# Patient Record
Sex: Female | Born: 1985 | Race: White | Hispanic: No | Marital: Married | Smoking: Never smoker
Health system: Southern US, Community
[De-identification: ages and names within clinical notes are randomized; demographics above are authoritative.]

## PROBLEM LIST (undated history)

## (undated) DIAGNOSIS — E039 Hypothyroidism, unspecified: Secondary | ICD-10-CM

## (undated) DIAGNOSIS — N289 Disorder of kidney and ureter, unspecified: Secondary | ICD-10-CM

## (undated) DIAGNOSIS — E119 Type 2 diabetes mellitus without complications: Secondary | ICD-10-CM

## (undated) HISTORY — PX: OTHER SURGICAL HISTORY: SHX169

## (undated) HISTORY — PX: ABDOMINAL HYSTERECTOMY: SHX81

---

## 2012-06-30 ENCOUNTER — Emergency Department: Payer: Self-pay | Admitting: Emergency Medicine

## 2012-06-30 LAB — COMPREHENSIVE METABOLIC PANEL
Alkaline Phosphatase: 47 U/L — ABNORMAL LOW (ref 50–136)
Bilirubin,Total: 0.4 mg/dL (ref 0.2–1.0)
Calcium, Total: 8.6 mg/dL (ref 8.5–10.1)
Creatinine: 0.69 mg/dL (ref 0.60–1.30)
EGFR (Non-African Amer.): >60
Glucose: 186 mg/dL — ABNORMAL HIGH (ref 65–99)
Osmolality: 275 (ref 275–301)
Potassium: 4.1 mmol/L (ref 3.5–5.1)
SGPT (ALT): 64 U/L (ref 12–78)
Sodium: 135 mmol/L — ABNORMAL LOW (ref 136–145)

## 2012-06-30 LAB — LIPASE, BLOOD: Lipase: 106 U/L (ref 73–393)

## 2012-06-30 LAB — CBC
HCT: 39.4 % (ref 35.0–47.0)
HGB: 13.5 g/dL (ref 12.0–16.0)
MCHC: 34.3 g/dL (ref 32.0–36.0)
MCV: 88 fL (ref 80–100)
Platelet: 232 10*3/uL (ref 150–440)
RDW: 13 % (ref 11.5–14.5)

## 2012-06-30 LAB — URINALYSIS, COMPLETE
Ketone: NEGATIVE
Nitrite: NEGATIVE
Ph: 5 (ref 4.5–8.0)
RBC,UR: 4 /HPF (ref 0–5)
Specific Gravity: 1.017 (ref 1.003–1.030)
WBC UR: 13 /HPF (ref 0–5)

## 2012-07-03 ENCOUNTER — Emergency Department: Payer: Self-pay | Admitting: Emergency Medicine

## 2012-07-03 LAB — URINALYSIS, COMPLETE
Ketone: NEGATIVE
Nitrite: NEGATIVE
Protein: 30
RBC,UR: 4 /HPF (ref 0–5)
Specific Gravity: 1.019 (ref 1.003–1.030)
Squamous Epithelial: 20
WBC UR: NONE SEEN /HPF (ref 0–5)

## 2012-07-03 LAB — COMPREHENSIVE METABOLIC PANEL
Bilirubin,Total: 0.3 mg/dL (ref 0.2–1.0)
Calcium, Total: 8.6 mg/dL (ref 8.5–10.1)
EGFR (African American): >60
Glucose: 147 mg/dL — ABNORMAL HIGH (ref 65–99)
Osmolality: 277 (ref 275–301)
SGPT (ALT): 74 U/L (ref 12–78)
Total Protein: 7.7 g/dL (ref 6.4–8.2)

## 2012-07-03 LAB — CBC
HCT: 38.1 % (ref 35.0–47.0)
MCH: 30.4 pg (ref 26.0–34.0)
MCHC: 34.6 g/dL (ref 32.0–36.0)
MCV: 88 fL (ref 80–100)
RBC: 4.34 10*6/uL (ref 3.80–5.20)
RDW: 12.9 % (ref 11.5–14.5)

## 2012-07-10 ENCOUNTER — Ambulatory Visit: Payer: Self-pay | Admitting: Specialist

## 2012-07-10 ENCOUNTER — Emergency Department: Payer: Self-pay | Admitting: Emergency Medicine

## 2012-07-10 LAB — CBC WITH DIFFERENTIAL/PLATELET
Basophil #: 0.1 10*3/uL (ref 0.0–0.1)
Basophil %: 1 %
Eosinophil #: 0.2 10*3/uL (ref 0.0–0.7)
Eosinophil %: 2.4 %
HGB: 13.5 g/dL (ref 12.0–16.0)
MCH: 30.5 pg (ref 26.0–34.0)
MCHC: 34.5 g/dL (ref 32.0–36.0)
Monocyte #: 0.4 x10 3/mm (ref 0.2–0.9)
Monocyte %: 6.2 %
Neutrophil %: 57 %
Platelet: 253 10*3/uL (ref 150–440)
RBC: 4.41 10*6/uL (ref 3.80–5.20)
RDW: 12.9 % (ref 11.5–14.5)

## 2012-07-10 LAB — IRON AND TIBC
Iron Saturation: 17 %
Iron: 54 ug/dL (ref 50–170)
Unbound Iron-Bind.Cap.: 268 ug/dL

## 2012-07-10 LAB — COMPREHENSIVE METABOLIC PANEL
Albumin: 3.6 g/dL (ref 3.4–5.0)
Calcium, Total: 8.1 mg/dL — ABNORMAL LOW (ref 8.5–10.1)
Chloride: 105 mmol/L (ref 98–107)
Co2: 24 mmol/L (ref 21–32)
Osmolality: 276 (ref 275–301)
SGPT (ALT): 79 U/L — ABNORMAL HIGH (ref 12–78)

## 2012-07-10 LAB — URINALYSIS, COMPLETE
Bilirubin,UR: NEGATIVE
Glucose,UR: NEGATIVE mg/dL (ref 0–75)
Ketone: NEGATIVE
Nitrite: NEGATIVE
Ph: 6 (ref 4.5–8.0)
RBC,UR: 108 /HPF (ref 0–5)
Squamous Epithelial: 3

## 2012-07-10 LAB — PHOSPHORUS: Phosphorus: 3.4 mg/dL (ref 2.5–4.9)

## 2012-07-10 LAB — FERRITIN: Ferritin (ARMC): 94 ng/mL (ref 8–388)

## 2012-07-10 LAB — MAGNESIUM: Magnesium: 1.7 mg/dL — ABNORMAL LOW

## 2012-07-10 LAB — HEMOGLOBIN A1C: Hemoglobin A1C: 6.6 % — ABNORMAL HIGH (ref 4.2–6.3)

## 2012-07-10 LAB — LIPASE, BLOOD: Lipase: 160 U/L (ref 73–393)

## 2012-07-10 LAB — PROTIME-INR: Prothrombin Time: 13.4 secs (ref 11.5–14.7)

## 2012-07-10 LAB — HEPATIC FUNCTION PANEL A (ARMC): Bilirubin, Direct: <0.1 mg/dL (ref 0.00–0.20)

## 2012-07-30 ENCOUNTER — Ambulatory Visit: Payer: Self-pay | Admitting: Specialist

## 2012-08-30 ENCOUNTER — Ambulatory Visit: Payer: Self-pay | Admitting: Specialist

## 2012-12-09 ENCOUNTER — Encounter (HOSPITAL_COMMUNITY): Payer: Self-pay | Admitting: Emergency Medicine

## 2012-12-09 ENCOUNTER — Emergency Department (HOSPITAL_COMMUNITY)
Admission: EM | Admit: 2012-12-09 | Discharge: 2012-12-09 | Disposition: A | Discharge status: Home / Self Care | Payer: Medicaid Other | Attending: Emergency Medicine | Admitting: Emergency Medicine

## 2012-12-09 DIAGNOSIS — Y92009 Unspecified place in unspecified non-institutional (private) residence as the place of occurrence of the external cause: Secondary | ICD-10-CM | POA: Insufficient documentation

## 2012-12-09 DIAGNOSIS — E109 Type 1 diabetes mellitus without complications: Secondary | ICD-10-CM | POA: Insufficient documentation

## 2012-12-09 DIAGNOSIS — T383X1A Poisoning by insulin and oral hypoglycemic [antidiabetic] drugs, accidental (unintentional), initial encounter: Secondary | ICD-10-CM | POA: Insufficient documentation

## 2012-12-09 DIAGNOSIS — Z794 Long term (current) use of insulin: Secondary | ICD-10-CM | POA: Insufficient documentation

## 2012-12-09 DIAGNOSIS — T38801A Poisoning by unspecified hormones and synthetic substitutes, accidental (unintentional), initial encounter: Secondary | ICD-10-CM | POA: Insufficient documentation

## 2012-12-09 DIAGNOSIS — Y9389 Activity, other specified: Secondary | ICD-10-CM | POA: Insufficient documentation

## 2012-12-09 DIAGNOSIS — Z79899 Other long term (current) drug therapy: Secondary | ICD-10-CM | POA: Insufficient documentation

## 2012-12-09 HISTORY — DX: Type 2 diabetes mellitus without complications: E11.9

## 2012-12-09 LAB — GLUCOSE, CAPILLARY: Glucose-Capillary: 154 mg/dL — ABNORMAL HIGH (ref 70–99)

## 2012-12-09 NOTE — ED Notes (Signed)
Pt. accidentally took Humalog 35 units at 12:50 mn instead of Lantus 35 units , cbg= 140 prior to arrival , alert and oriented , respirations unlabored , denies nay pain or discomfort .

## 2012-12-09 NOTE — ED Notes (Signed)
Patient was at home with her two children and took the wrong insulin, originally took 35 units of humalog, called the hospital talked to the oncall physician and was recommended to come to the ER

## 2012-12-09 NOTE — ED Provider Notes (Signed)
CSN: [id number]     Arrival date & time 12/09/12  0145 History   First MD Initiated Contact with Patient 12/09/12 0223     No chief complaint on file.  (Consider location/radiation/quality/duration/timing/severity/associated sxs/prior Treatment) HPI History provided by patient. There is a type I diabetic prescribed Ultram Lantus and Humalog. Her previous physician was in [place] and she has recently moved to [place]. She intended to take 35 units of Lantus before bed, and unintentionally took 35 units of her Humalog.  She checked her blood sugar was 140. She drank to orange juice boxes and called her previous physician office who recommended she call EMS and come to the emergency department.  No syncope.  She denies any weakness, fatigue, shaking or symptoms of hypoglycemia.  No history of same.    Past Medical History  Diagnosis Date  . Diabetes mellitus without complication    Past Surgical History  Procedure Laterality Date  . Cesarean section    . Abdominal hysterectomy    . Oophoectomy     No family history on file. History  Substance Use Topics  . Smoking status: Never Smoker   . Smokeless tobacco: Not on file  . Alcohol Use: No   OB History   Grav Para Term Preterm Abortions TAB SAB Ect Mult Living                 Review of Systems  Constitutional: Negative for fever and chills.  HENT: Negative for neck pain and neck stiffness.   Eyes: Negative for pain.  Respiratory: Negative for shortness of breath.   Cardiovascular: Negative for chest pain.  Gastrointestinal: Negative for abdominal pain.  Genitourinary: Negative for dysuria.  Musculoskeletal: Negative for back pain.  Skin: Negative for rash.  Neurological: Negative for headaches.  All other systems reviewed and are negative.    Allergies  Percocet  Home Medications   Current Outpatient Rx  Name  Route  Sig  Dispense  Refill  . insulin glargine (LANTUS) 100 UNIT/ML injection   Subcutaneous   Inject  35 Units into the skin at bedtime.         . insulin lispro (HUMALOG) 100 UNIT/ML injection   Subcutaneous   Inject 5 Units into the skin 3 (three) times daily before meals.         Marland Kitchen levothyroxine (SYNTHROID, LEVOTHROID) 88 MCG tablet   Oral   Take 88 mcg by mouth daily before breakfast.          BP 135/85  Pulse 89  Temp(Src) 98 F (36.7 C) (Oral)  Resp 18  SpO2 98% Physical Exam  Constitutional: She is oriented to person, place, and time. She appears well-developed and well-nourished.  HENT:  Head: Normocephalic and atraumatic.  Eyes: EOM are normal. Pupils are equal, round, and reactive to light.  Neck: Neck supple.  Cardiovascular: Normal rate, regular rhythm and intact distal pulses.   Pulmonary/Chest: Effort normal and breath sounds normal. No respiratory distress.  Abdominal: Soft. Bowel sounds are normal. She exhibits no distension. There is no tenderness.  Musculoskeletal: Normal range of motion. She exhibits no edema.  Neurological: She is alert and oriented to person, place, and time.  Skin: Skin is warm and dry.    ED Course  Procedures (including critical care time) Labs Review Labs Reviewed  GLUCOSE, CAPILLARY - Abnormal; Notable for the following:    Glucose-Capillary 154 (*)    All other components within normal limits  GLUCOSE, CAPILLARY - Abnormal; Notable for  the following:    Glucose-Capillary 156 (*)    All other components within normal limits  GLUCOSE, CAPILLARY - Abnormal; Notable for the following:    Glucose-Capillary 159 (*)    All other components within normal limits  GLUCOSE, CAPILLARY - Abnormal; Notable for the following:    Glucose-Capillary 193 (*)    All other components within normal limits   Discussed with pharmacist regarding reporting to insulin pen manufacturer - both Humalog and Lantus penss have gray colored tops.  Poison control notified and recommends 4 hours of observation - patient agreeable to plan.  5:57 AM  blood sugars in normal range. Patient given outpatient referral and she agrees to all discharge and followup instructions. MDM  Diagnosis: Medication error with insulin pen  Blood sugar monitoring Serial evaluations  Vital signs and nurses notes reviewed and considered    Sunnie Nielsen, MD 12/09/12 [phone]

## 2014-06-09 ENCOUNTER — Emergency Department (HOSPITAL_COMMUNITY)
Admission: EM | Admit: 2014-06-09 | Discharge: 2014-06-09 | Disposition: A | Discharge status: Home / Self Care | Payer: Medicaid Other | Attending: Emergency Medicine | Admitting: Emergency Medicine

## 2014-06-09 ENCOUNTER — Encounter (HOSPITAL_COMMUNITY): Payer: Self-pay | Admitting: Physical Medicine and Rehabilitation

## 2014-06-09 ENCOUNTER — Emergency Department (HOSPITAL_COMMUNITY): Payer: Medicaid Other

## 2014-06-09 DIAGNOSIS — R51 Headache: Secondary | ICD-10-CM | POA: Insufficient documentation

## 2014-06-09 DIAGNOSIS — Z794 Long term (current) use of insulin: Secondary | ICD-10-CM | POA: Insufficient documentation

## 2014-06-09 DIAGNOSIS — Z9889 Other specified postprocedural states: Secondary | ICD-10-CM | POA: Diagnosis not present

## 2014-06-09 DIAGNOSIS — Z3202 Encounter for pregnancy test, result negative: Secondary | ICD-10-CM | POA: Insufficient documentation

## 2014-06-09 DIAGNOSIS — R197 Diarrhea, unspecified: Secondary | ICD-10-CM | POA: Insufficient documentation

## 2014-06-09 DIAGNOSIS — E119 Type 2 diabetes mellitus without complications: Secondary | ICD-10-CM | POA: Diagnosis not present

## 2014-06-09 DIAGNOSIS — Z79899 Other long term (current) drug therapy: Secondary | ICD-10-CM | POA: Insufficient documentation

## 2014-06-09 DIAGNOSIS — Z9079 Acquired absence of other genital organ(s): Secondary | ICD-10-CM | POA: Diagnosis not present

## 2014-06-09 DIAGNOSIS — Z90710 Acquired absence of both cervix and uterus: Secondary | ICD-10-CM | POA: Insufficient documentation

## 2014-06-09 DIAGNOSIS — R1011 Right upper quadrant pain: Secondary | ICD-10-CM | POA: Diagnosis not present

## 2014-06-09 DIAGNOSIS — E039 Hypothyroidism, unspecified: Secondary | ICD-10-CM | POA: Diagnosis not present

## 2014-06-09 DIAGNOSIS — R112 Nausea with vomiting, unspecified: Secondary | ICD-10-CM | POA: Diagnosis not present

## 2014-06-09 DIAGNOSIS — R109 Unspecified abdominal pain: Secondary | ICD-10-CM | POA: Diagnosis present

## 2014-06-09 HISTORY — DX: Hypothyroidism, unspecified: E03.9

## 2014-06-09 LAB — URINE MICROSCOPIC-ADD ON

## 2014-06-09 LAB — COMPREHENSIVE METABOLIC PANEL
ALK PHOS: 50 U/L (ref 39–117)
ALT: 24 U/L (ref 0–35)
ANION GAP: 5 (ref 5–15)
AST: 17 U/L (ref 0–37)
Albumin: 3.9 g/dL (ref 3.5–5.2)
BUN: 8 mg/dL (ref 6–23)
CALCIUM: 8.8 mg/dL (ref 8.4–10.5)
CO2: 26 mmol/L (ref 19–32)
Chloride: 105 mmol/L (ref 96–112)
Creatinine, Ser: 0.65 mg/dL (ref 0.50–1.10)
GFR calc Af Amer: >90 mL/min (ref >90)
GFR calc non Af Amer: >90 mL/min (ref >90)
GLUCOSE: 139 mg/dL — AB (ref 70–99)
POTASSIUM: 3.8 mmol/L (ref 3.5–5.1)
Sodium: 136 mmol/L (ref 135–145)
Total Bilirubin: 0.9 mg/dL (ref 0.3–1.2)
Total Protein: 7.6 g/dL (ref 6.0–8.3)

## 2014-06-09 LAB — URINALYSIS, ROUTINE W REFLEX MICROSCOPIC
Bilirubin Urine: NEGATIVE
GLUCOSE, UA: NEGATIVE mg/dL
HGB URINE DIPSTICK: NEGATIVE
KETONES UR: NEGATIVE mg/dL
Leukocytes, UA: NEGATIVE
Nitrite: NEGATIVE
PH: 8 (ref 5.0–8.0)
Protein, ur: 100 mg/dL — AB
Specific Gravity, Urine: 1.027 (ref 1.005–1.030)
UROBILINOGEN UA: 1 mg/dL (ref 0.0–1.0)

## 2014-06-09 LAB — CBC WITH DIFFERENTIAL/PLATELET
BASOS ABS: 0 10*3/uL (ref 0.0–0.1)
Basophils Relative: 0 % (ref 0–1)
Eosinophils Absolute: 0 10*3/uL (ref 0.0–0.7)
Eosinophils Relative: 1 % (ref 0–5)
HEMATOCRIT: 40.8 % (ref 36.0–46.0)
Hemoglobin: 14.1 g/dL (ref 12.0–15.0)
LYMPHS ABS: 1.1 10*3/uL (ref 0.7–4.0)
LYMPHS PCT: 17 % (ref 12–46)
MCH: 29.5 pg (ref 26.0–34.0)
MCHC: 34.6 g/dL (ref 30.0–36.0)
MCV: 85.4 fL (ref 78.0–100.0)
MONO ABS: 0.3 10*3/uL (ref 0.1–1.0)
Monocytes Relative: 5 % (ref 3–12)
NEUTROS ABS: 5 10*3/uL (ref 1.7–7.7)
NEUTROS PCT: 78 % — AB (ref 43–77)
PLATELETS: 230 10*3/uL (ref 150–400)
RBC: 4.78 MIL/uL (ref 3.87–5.11)
RDW: 12.3 % (ref 11.5–15.5)
WBC: 6.4 10*3/uL (ref 4.0–10.5)

## 2014-06-09 LAB — LIPASE, BLOOD: Lipase: 18 U/L (ref 11–59)

## 2014-06-09 LAB — POC URINE PREG, ED: Preg Test, Ur: NEGATIVE

## 2014-06-09 MED ORDER — HYDROMORPHONE HCL 1 MG/ML IJ SOLN
1.0000 mg | INTRAMUSCULAR | Status: AC
  Administered 2014-06-09: 1 mg via INTRAVENOUS
  Filled 2014-06-09: qty 1

## 2014-06-09 MED ORDER — ONDANSETRON 4 MG PO TBDP: ORAL_TABLET | ORAL | Status: DC

## 2014-06-09 MED ORDER — ONDANSETRON HCL 4 MG/2ML IJ SOLN
4.0000 mg | Freq: Once | INTRAMUSCULAR | Status: AC
  Administered 2014-06-09: 4 mg via INTRAVENOUS
  Filled 2014-06-09: qty 2

## 2014-06-09 MED ORDER — SODIUM CHLORIDE 0.9 % IV BOLUS (SEPSIS)
1000.0000 mL | INTRAVENOUS | Status: AC
  Administered 2014-06-09: 1000 mL via INTRAVENOUS

## 2014-06-09 MED ORDER — METOCLOPRAMIDE HCL 5 MG/ML IJ SOLN
5.0000 mg | Freq: Once | INTRAMUSCULAR | Status: AC
  Administered 2014-06-09: 5 mg via INTRAVENOUS
  Filled 2014-06-09: qty 2

## 2014-06-09 MED ORDER — DIPHENHYDRAMINE HCL 50 MG/ML IJ SOLN
12.5000 mg | Freq: Once | INTRAMUSCULAR | Status: AC
  Administered 2014-06-09: 12.5 mg via INTRAVENOUS
  Filled 2014-06-09: qty 1

## 2014-06-09 NOTE — ED Provider Notes (Signed)
CSN: [id number]     Arrival date & time 06/09/14  1011 History   First MD Initiated Contact with Patient 06/09/14 1130     Chief Complaint  Patient presents with  . Emesis  . Abdominal Pain     (Consider location/radiation/quality/duration/timing/severity/associated sxs/prior Treatment) Patient is a 30 y.o. female presenting with vomiting and abdominal pain. The history is provided by the patient.  Emesis Severity:  Mild Duration:  1 day Timing:  Constant Quality:  Stomach contents Progression:  Unchanged Chronicity:  New Recent urination:  Normal Relieved by:  Nothing Worsened by:  Nothing tried Ineffective treatments:  None tried Associated symptoms: abdominal pain, diarrhea and headaches   Abdominal pain:    Location:  RUQ   Quality:  Sharp   Severity:  Mild   Onset quality:  Sudden   Duration:  1 day   Timing:  Constant   Progression:  Unchanged   Chronicity:  New Headaches:    Severity:  Mild   Onset quality:  Gradual   Duration:  4 days   Timing:  Intermittent   Progression:  Unchanged   Chronicity:  New Abdominal Pain Associated symptoms: diarrhea, nausea and vomiting   Associated symptoms: no chest pain, no cough, no dysuria, no fatigue, no fever, no hematuria and no shortness of breath     Past Medical History  Diagnosis Date  . Diabetes mellitus without complication   . Hypothyroid    Past Surgical History  Procedure Laterality Date  . Cesarean section    . Abdominal hysterectomy    . Oophoectomy     No family history on file. History  Substance Use Topics  . Smoking status: Never Smoker   . Smokeless tobacco: Not on file  . Alcohol Use: No   OB History    No data available     Review of Systems  Constitutional: Negative for fever and fatigue.  HENT: Negative for congestion and drooling.   Eyes: Negative for pain.  Respiratory: Negative for cough and shortness of breath.   Cardiovascular: Negative for chest pain.  Gastrointestinal:  Positive for nausea, vomiting, abdominal pain and diarrhea.  Genitourinary: Negative for dysuria and hematuria.  Musculoskeletal: Negative for back pain, gait problem and neck pain.  Skin: Negative for color change.  Neurological: Positive for headaches. Negative for dizziness.  Hematological: Negative for adenopathy.  Psychiatric/Behavioral: Negative for behavioral problems.  All other systems reviewed and are negative.     Allergies  Percocet  Home Medications   Prior to Admission medications   Medication Sig Start Date End Date Taking? Authorizing Provider  insulin glargine (LANTUS) 100 UNIT/ML injection Inject 35 Units into the skin at bedtime.    Historical Provider, MD  insulin lispro (HUMALOG) 100 UNIT/ML injection Inject 5 Units into the skin 3 (three) times daily before meals.    Historical Provider, MD  levothyroxine (SYNTHROID, LEVOTHROID) 88 MCG tablet Take 88 mcg by mouth daily before breakfast.    Historical Provider, MD   BP 125/73 mmHg  Pulse 94  Temp(Src) 98.5 F (36.9 C) (Oral)  Resp 18  Ht 5' 6.5" (1.689 m)  Wt 300 lb (136.079 kg)  BMI 47.70 kg/m2  SpO2 98% Physical Exam  Constitutional: She is oriented to person, place, and time. She appears well-developed and well-nourished.  HENT:  Head: Normocephalic.  Mouth/Throat: Oropharynx is clear and moist. No oropharyngeal exudate.  Eyes: Conjunctivae and EOM are normal. Pupils are equal, round, and reactive to light.  Neck: Normal  range of motion. Neck supple.  Cardiovascular: Normal rate, regular rhythm, normal heart sounds and intact distal pulses.  Exam reveals no gallop and no friction rub.   No murmur heard. Pulmonary/Chest: Effort normal and breath sounds normal. No respiratory distress. She has no wheezes.  Abdominal: Soft. Bowel sounds are normal. There is tenderness (mild ttp of RUQ). There is no rebound and no guarding.  Musculoskeletal: Normal range of motion. She exhibits no edema or tenderness.   Neurological: She is alert and oriented to person, place, and time.  alert, oriented x3 speech: normal in context and clarity memory: intact grossly cranial nerves II-XII: intact motor strength: full proximally and distally no involuntary movements or tremors sensation: intact to light touch diffusely  cerebellar: finger-to-nose and heel-to-shin intact gait: normal gait  Skin: Skin is warm and dry.  Psychiatric: She has a normal mood and affect. Her behavior is normal.  Nursing note and vitals reviewed.   ED Course  Procedures (including critical care time) Labs Review Labs Reviewed  CBC WITH DIFFERENTIAL/PLATELET - Abnormal; Notable for the following:    Neutrophils Relative % 78 (*)    All other components within normal limits  COMPREHENSIVE METABOLIC PANEL - Abnormal; Notable for the following:    Glucose, Bld 139 (*)    All other components within normal limits  URINALYSIS, ROUTINE W REFLEX MICROSCOPIC - Abnormal; Notable for the following:    APPearance CLOUDY (*)    Protein, ur 100 (*)    All other components within normal limits  URINE MICROSCOPIC-ADD ON - Abnormal; Notable for the following:    Squamous Epithelial / LPF MANY (*)    Bacteria, UA FEW (*)    All other components within normal limits  LIPASE, BLOOD  POC URINE PREG, ED    Imaging Review No results found.   EKG Interpretation None      MDM   Final diagnoses:  RUQ pain  Nausea and vomiting, vomiting of unspecified type    11:47 AM 29 y.o. female with a history of diabetes and hypothyroidism who presents with intermittent headaches for the last 4-5 days and nausea/vomiting and right upper quadrant pain which began yesterday. She denies any fevers. Has had some mild diarrhea. Has a history of cholecystectomy. Also has a history of kidney stones. Will get pain control, screening labs, imaging.   4:12 PM: I interpreted/reviewed the labs and/or imaging which were non-contributory.  Pt continues to  appear well.  Pt tolerating po here.  I have discussed the diagnosis/risks/treatment options with the patient and believe the pt to be eligible for discharge home to follow-up with her pcp. We also discussed returning to the ED immediately if new or worsening sx occur. We discussed the sx which are most concerning (e.g., worsening pain, intractable fever, inc vomiting) that necessitate immediate return. Medications administered to the patient during their visit and any new prescriptions provided to the patient are listed below.  Medications given during this visit Medications  sodium chloride 0.9 % bolus 1,000 mL (0 mLs Intravenous Stopped 06/09/14 1330)  HYDROmorphone (DILAUDID) injection 1 mg (1 mg Intravenous Given 06/09/14 1201)  metoCLOPramide (REGLAN) injection 5 mg (5 mg Intravenous Given 06/09/14 1201)  diphenhydrAMINE (BENADRYL) injection 12.5 mg (12.5 mg Intravenous Given 06/09/14 1201)  ondansetron (ZOFRAN) injection 4 mg (4 mg Intravenous Given 06/09/14 1333)    New Prescriptions   ONDANSETRON (ZOFRAN ODT) 4 MG DISINTEGRATING TABLET     ODT q4 hours prn nausea/vomit  Purvis SheffieldForrest Randen Kauth, MD 06/09/14 1705

## 2014-06-09 NOTE — ED Notes (Signed)
Patient transported to CT 

## 2014-06-09 NOTE — ED Notes (Signed)
Pt presents to department for evaluation of nausea/vomiting and RUQ pain, also states headache. Symptoms onset yesterday. 9/10 abdominal pain upon arrival to ED. Pt is alert and oriented x4. NAD.

## 2014-06-09 NOTE — ED Notes (Signed)
Returned from CT.

## 2014-06-09 NOTE — Discharge Instructions (Signed)
Abdominal Pain, Women °Abdominal (stomach, pelvic, or belly) pain can be caused by many things. It is important to tell your doctor: °· The location of the pain. °· Does it come and go or is it present all the time? °· Are there things that start the pain (eating certain foods, exercise)? °· Are there other symptoms associated with the pain (fever, nausea, vomiting, diarrhea)? °All of this is helpful to know when trying to find the cause of the pain. °CAUSES  °· Stomach: virus or bacteria infection, or ulcer. °· Intestine: appendicitis (inflamed appendix), regional ileitis (Crohn's disease), ulcerative colitis (inflamed colon), irritable bowel syndrome, diverticulitis (inflamed diverticulum of the colon), or cancer of the stomach or intestine. °· Gallbladder disease or stones in the gallbladder. °· Kidney disease, kidney stones, or infection. °· Pancreas infection or cancer. °· Fibromyalgia (pain disorder). °· Diseases of the female organs: °¨ Uterus: fibroid (non-cancerous) tumors or infection. °¨ Fallopian tubes: infection or tubal pregnancy. °¨ Ovary: cysts or tumors. °¨ Pelvic adhesions (scar tissue). °¨ Endometriosis (uterus lining tissue growing in the pelvis and on the pelvic organs). °¨ Pelvic congestion syndrome (female organs filling up with blood just before the menstrual period). °¨ Pain with the menstrual period. °¨ Pain with ovulation (producing an egg). °¨ Pain with an IUD (intrauterine device, birth control) in the uterus. °¨ Cancer of the female organs. °· Functional pain (pain not caused by a disease, may improve without treatment). °· Psychological pain. °· Depression. °DIAGNOSIS  °Your doctor will decide the seriousness of your pain by doing an examination. °· Blood tests. °· X-rays. °· Ultrasound. °· CT scan (computed tomography, special type of X-ray). °· MRI (magnetic resonance imaging). °· Cultures, for infection. °· Barium enema (dye inserted in the large intestine, to better view it with  X-rays). °· Colonoscopy (looking in intestine with a lighted tube). °· Laparoscopy (minor surgery, looking in abdomen with a lighted tube). °· Major abdominal exploratory surgery (looking in abdomen with a large incision). °TREATMENT  °The treatment will depend on the cause of the pain.  °· Many cases can be observed and treated at home. °· Over-the-counter medicines recommended by your caregiver. °· Prescription medicine. °· Antibiotics, for infection. °· Birth control pills, for painful periods or for ovulation pain. °· Hormone treatment, for endometriosis. °· Nerve blocking injections. °· Physical therapy. °· Antidepressants. °· Counseling with a psychologist or psychiatrist. °· Minor or major surgery. °HOME CARE INSTRUCTIONS  °· Do not take laxatives, unless directed by your caregiver. °· Take over-the-counter pain medicine only if ordered by your caregiver. Do not take aspirin because it can cause an upset stomach or bleeding. °· Try a clear liquid diet (broth or water) as ordered by your caregiver. Slowly move to a bland diet, as tolerated, if the pain is related to the stomach or intestine. °· Have a thermometer and take your temperature several times a day, and record it. °· Bed rest and sleep, if it helps the pain. °· Avoid sexual intercourse, if it causes pain. °· Avoid stressful situations. °· Keep your follow-up appointments and tests, as your caregiver orders. °· If the pain does not go away with medicine or surgery, you may try: °¨ Acupuncture. °¨ Relaxation exercises (yoga, meditation). °¨ Group therapy. °¨ Counseling. °SEEK MEDICAL CARE IF:  °· You notice certain foods cause stomach pain. °· Your home care treatment is not helping your pain. °· You need stronger pain medicine. °· You want your IUD removed. °· You feel faint or   lightheaded. °· You develop nausea and vomiting. °· You develop a rash. °· You are having side effects or an allergy to your medicine. °SEEK IMMEDIATE MEDICAL CARE IF:  °· Your  pain does not go away or gets worse. °· You have a fever. °· Your pain is felt only in portions of the abdomen. The right side could possibly be appendicitis. The left lower portion of the abdomen could be colitis or diverticulitis. °· You are passing blood in your stools (bright red or black tarry stools, with or without vomiting). °· You have blood in your urine. °· You develop chills, with or without a fever. °· You pass out. °MAKE SURE YOU:  °· Understand these instructions. °· Will watch your condition. °· Will get help right away if you are not doing well or get worse. °Document Released: 01/13/2007 Document Revised: 08/02/2013 Document Reviewed: 02/02/2009 °[employer]® Patient Information ©2015 [employer]. This information is not intended to replace advice given to you by your health care provider. Make sure you discuss any questions you have with your health care provider. ° °

## 2015-09-19 ENCOUNTER — Emergency Department (HOSPITAL_BASED_OUTPATIENT_CLINIC_OR_DEPARTMENT_OTHER)
Admission: EM | Admit: 2015-09-19 | Discharge: 2015-09-20 | Disposition: A | Discharge status: Home / Self Care | Payer: Medicaid Other | Attending: Emergency Medicine | Admitting: Emergency Medicine

## 2015-09-19 ENCOUNTER — Encounter (HOSPITAL_BASED_OUTPATIENT_CLINIC_OR_DEPARTMENT_OTHER): Payer: Self-pay | Admitting: Emergency Medicine

## 2015-09-19 DIAGNOSIS — E039 Hypothyroidism, unspecified: Secondary | ICD-10-CM | POA: Insufficient documentation

## 2015-09-19 DIAGNOSIS — E119 Type 2 diabetes mellitus without complications: Secondary | ICD-10-CM | POA: Diagnosis not present

## 2015-09-19 DIAGNOSIS — R112 Nausea with vomiting, unspecified: Secondary | ICD-10-CM | POA: Diagnosis not present

## 2015-09-19 DIAGNOSIS — R1032 Left lower quadrant pain: Secondary | ICD-10-CM | POA: Insufficient documentation

## 2015-09-19 DIAGNOSIS — R109 Unspecified abdominal pain: Secondary | ICD-10-CM

## 2015-09-19 HISTORY — DX: Disorder of kidney and ureter, unspecified: N28.9

## 2015-09-19 LAB — PREGNANCY, URINE: Preg Test, Ur: NEGATIVE

## 2015-09-19 NOTE — ED Provider Notes (Addendum)
By signing my name below, I, [facility] Washington, attest that this documentation has been prepared under the direction and in the presence of Enbridge EnergyKristen N Cindra Austad, DO. Electronically Signed: Randell PatientMarrissa Washington, ED Scribe. 09/20/2015. 12:03 AM.  TIME SEEN: 11:58 PM  CHIEF COMPLAINT: Left flank pain  HPI: HPI Comments: Natalie Horton is a 30 y.o. female with an hx of DM, kidney stones, hypothyroidism who presents to the Emergency Department complaining of constant, waxing and waning, moderate left flank pain onset 3 hours ago. Pt reports nausea earlier today after which she took at nap followed by flank pain. She reports abdominal pain in the lower left abdomen where the pain is radiating from the left flank, vomiting, and hematochezia that is baseline for her. States she has hemorrhoids and that is where her hematochezia is coming from. No melena. She has taken Zofran, most recently 5 hours ago. She has an hx of cesarean sections, cholecystectomy, right oophorectomy. She notes similar symptoms six different times in the past with her previous kidney stones that usually resolve with increasing fluid intake. LMP 7 years ago and pt notes that she has an IUD in place. Denies dysuria, hematuria, or any other symptoms currently. States she finds that it is hard to initiate urination. Does not have a urologist. Has never had any surgery, procedures to remove her stones. Had a CT scan in 2014 which confirmed a distal ureteral stone.   ROS: See HPI Constitutional: no fever  Eyes: no drainage  ENT: no runny nose   Cardiovascular:  no chest pain  Resp: no SOB  GI:  vomiting GU: no dysuria Integumentary: no rash  Allergy: no hives  Musculoskeletal: no leg swelling  Neurological: no slurred speech ROS otherwise negative  PAST MEDICAL HISTORY/PAST SURGICAL HISTORY:  Past Medical History  Diagnosis Date  . Diabetes mellitus without complication (HCC)   . Hypothyroid   . Renal disorder     kidney stones     MEDICATIONS:  Prior to Admission medications   Medication Sig Start Date End Date Taking? Authorizing Provider  albuterol (PROVENTIL HFA;VENTOLIN HFA) 108 (90 BASE) MCG/ACT inhaler Inhale 1 puff into the lungs every 6 (six) hours as needed for wheezing or shortness of breath.    Historical Provider, MD  amphetamine-dextroamphetamine (ADDERALL XR) 10 MG 24 hr capsule Take 10 mg by mouth 2 (two) times daily.    Historical Provider, MD  fluticasone (FLONASE) 50 MCG/ACT nasal spray Place 1 spray into both nostrils daily.    Historical Provider, MD  glipiZIDE (GLUCOTROL XL) 10 MG 24 hr tablet Take 10 mg by mouth daily with breakfast.    Historical Provider, MD  insulin glargine (LANTUS) 100 UNIT/ML injection Inject 35 Units into the skin at bedtime.    Historical Provider, MD  insulin lispro (HUMALOG) 100 UNIT/ML injection Inject 5 Units into the skin 3 (three) times daily before meals.    Historical Provider, MD  levothyroxine (SYNTHROID, LEVOTHROID) 112 MCG tablet Take 112 mcg by mouth daily before breakfast.    Historical Provider, MD  levothyroxine (SYNTHROID, LEVOTHROID) 88 MCG tablet Take 88 mcg by mouth daily before breakfast.    Historical Provider, MD  ondansetron (ZOFRAN ODT) 4 MG disintegrating tablet 4mg  ODT q4 hours prn nausea/vomit 06/09/14   Purvis SheffieldForrest Harrison, MD    ALLERGIES:  Allergies  Allergen Reactions  . Percocet [Oxycodone-Acetaminophen] Other (See Comments)    Pass out   . Vicodin [Hydrocodone-Acetaminophen] Other (See Comments)    Vomit     SOCIAL  HISTORY:  Social History  Substance Use Topics  . Smoking status: Never Smoker   . Smokeless tobacco: Not on file  . Alcohol Use: No    FAMILY HISTORY: History reviewed. No pertinent family history.  EXAM: BP 143/105 mmHg  Pulse 100  Temp(Src) 98.9 F (37.2 C) (Oral)  Resp 22  Ht  (1.702 m)  Wt 300 lb (136.079 kg)  BMI 46.98 kg/m2  SpO2 100% CONSTITUTIONAL: Alert and oriented and responds  appropriately to questions. Obese, appears uncomfortable, well-hydrated, afebrile and nontoxic HEAD: Normocephalic EYES: Conjunctivae clear, PERRL ENT: normal nose; no rhinorrhea; moist mucous membranes NECK: Supple, no meningismus, no LAD  CARD: RRR; S1 and S2 appreciated; no murmurs, no clicks, no rubs, no gallops RESP: Normal chest excursion without splinting or tachypnea; breath sounds clear and equal bilaterally; no wheezes, no rhonchi, no rales, no hypoxia or respiratory distress, speaking full sentences ABD/GI: Normal bowel sounds; non-distended; soft, non-tender, no rebound, no guarding, no peritoneal signs BACK:  The back appears normal and is non-tender to palpation, there is no CVA tenderness EXT: Normal ROM in all joints; non-tender to palpation; no edema; normal capillary refill; no cyanosis, no calf tenderness or swelling    SKIN: Normal color for age and race; warm; no rash NEURO: Moves all extremities equally, sensation to light touch intact diffusely, cranial nerves II through XII intact PSYCH: The patient's mood and manner are appropriate. Grooming and personal hygiene are appropriate.  MEDICAL DECISION MAKING: Patient here with left-sided flank pain similar to her prior kidney since. Has had vomiting. No fever, dysuria. Pain radiates into the lower abdomen. Has had hematochezia but states she has hemorrhoids and states this is normal for her. No large amount of bleeding, melena. Abdominal exam is benign. She does appear uncomfortable. We'll give Toradol, Dilaudid, Zofran, IV fluids. Suspect kidney stone. Has had CT scans in the past confirming ureterolithiasis. I do not feel this time the CT be repeated. Labs, urine pending. She is not pregnant.  ED PROGRESS: 1:00 AM  Pt's labs are unremarkable. Urine shows no blood or sign of infection. She reports feeling much better. Suspect possible kidney stone. We'll discharge with vicodin, ibuprofen, Zofran and have her follow-up with  urology as an outpatient. This time I do not feel she needs emergent abdominal imaging including CT scan as I feel the risk of radiation exposure outweighs any benefits. She has had prior CT scans in the past that confirmed ureterolithiasis. Discussed return precautions. She verbalized understanding and is comfortable with this plan.   At this time, I do not feel there is any life-threatening condition present. I have reviewed and discussed all results (EKG, imaging, lab, urine as appropriate), exam findings with patient. I have reviewed nursing notes and appropriate previous records.  I feel the patient is safe to be discharged home without further emergent workup. Discussed usual and customary return precautions. Patient and family (if present) verbalize understanding and are comfortable with this plan.  Patient will follow-up with their primary care provider. If they do not have a primary care provider, information for follow-up has been provided to them. All questions have been answered.      I personally performed the services described in this documentation, which was scribed in my presence. The recorded information has been reviewed and is accurate.    Layla Maw Andruw Battie, DO 09/20/15 0102  Layla Maw Dominique Ressel, DO 09/20/15 1610

## 2015-09-19 NOTE — ED Notes (Signed)
Patient reports that she is having pain to her left flank pain  - patient reports that she is having Nausea and Vomiting

## 2015-09-20 ENCOUNTER — Emergency Department (HOSPITAL_BASED_OUTPATIENT_CLINIC_OR_DEPARTMENT_OTHER): Payer: Medicaid Other

## 2015-09-20 ENCOUNTER — Emergency Department (HOSPITAL_BASED_OUTPATIENT_CLINIC_OR_DEPARTMENT_OTHER)
Admission: EM | Admit: 2015-09-20 | Discharge: 2015-09-20 | Disposition: A | Discharge status: Home / Self Care | Payer: Medicaid Other | Source: Home / Self Care | Attending: Emergency Medicine | Admitting: Emergency Medicine

## 2015-09-20 ENCOUNTER — Encounter (HOSPITAL_BASED_OUTPATIENT_CLINIC_OR_DEPARTMENT_OTHER): Payer: Self-pay

## 2015-09-20 DIAGNOSIS — R109 Unspecified abdominal pain: Secondary | ICD-10-CM

## 2015-09-20 DIAGNOSIS — R112 Nausea with vomiting, unspecified: Secondary | ICD-10-CM

## 2015-09-20 DIAGNOSIS — E119 Type 2 diabetes mellitus without complications: Secondary | ICD-10-CM

## 2015-09-20 DIAGNOSIS — E039 Hypothyroidism, unspecified: Secondary | ICD-10-CM | POA: Insufficient documentation

## 2015-09-20 DIAGNOSIS — N200 Calculus of kidney: Secondary | ICD-10-CM | POA: Insufficient documentation

## 2015-09-20 LAB — COMPREHENSIVE METABOLIC PANEL
ALK PHOS: 49 U/L (ref 38–126)
ALT: 34 U/L (ref 14–54)
AST: 20 U/L (ref 15–41)
Albumin: 4.2 g/dL (ref 3.5–5.0)
Anion gap: 7 (ref 5–15)
BILIRUBIN TOTAL: 0.9 mg/dL (ref 0.3–1.2)
BUN: 9 mg/dL (ref 6–20)
CALCIUM: 9.1 mg/dL (ref 8.9–10.3)
CO2: 26 mmol/L (ref 22–32)
CREATININE: 0.57 mg/dL (ref 0.44–1.00)
Chloride: 100 mmol/L — ABNORMAL LOW (ref 101–111)
Glucose, Bld: 236 mg/dL — ABNORMAL HIGH (ref 65–99)
Potassium: 3.8 mmol/L (ref 3.5–5.1)
Sodium: 133 mmol/L — ABNORMAL LOW (ref 135–145)
TOTAL PROTEIN: 7.9 g/dL (ref 6.5–8.1)

## 2015-09-20 LAB — URINE MICROSCOPIC-ADD ON: RBC / HPF: NONE SEEN RBC/hpf (ref 0–5)

## 2015-09-20 LAB — URINALYSIS, ROUTINE W REFLEX MICROSCOPIC
BILIRUBIN URINE: NEGATIVE
Bilirubin Urine: NEGATIVE
GLUCOSE, UA: 250 mg/dL — AB
Glucose, UA: 100 mg/dL — AB
HGB URINE DIPSTICK: NEGATIVE
Hgb urine dipstick: NEGATIVE
KETONES UR: NEGATIVE mg/dL
Ketones, ur: NEGATIVE mg/dL
LEUKOCYTES UA: NEGATIVE
Leukocytes, UA: NEGATIVE
NITRITE: NEGATIVE
Nitrite: NEGATIVE
PH: 7 (ref 5.0–8.0)
PH: 7 (ref 5.0–8.0)
Protein, ur: 100 mg/dL — AB
Protein, ur: 100 mg/dL — AB
SPECIFIC GRAVITY, URINE: 1.016 (ref 1.005–1.030)
Specific Gravity, Urine: 1.02 (ref 1.005–1.030)

## 2015-09-20 LAB — CBC WITH DIFFERENTIAL/PLATELET
Basophils Absolute: 0 10*3/uL (ref 0.0–0.1)
Basophils Relative: 0 %
Eosinophils Absolute: 0.1 10*3/uL (ref 0.0–0.7)
Eosinophils Relative: 1 %
HCT: 40.8 % (ref 36.0–46.0)
HEMOGLOBIN: 14.1 g/dL (ref 12.0–15.0)
LYMPHS ABS: 2 10*3/uL (ref 0.7–4.0)
LYMPHS PCT: 21 %
MCH: 29.6 pg (ref 26.0–34.0)
MCHC: 34.6 g/dL (ref 30.0–36.0)
MCV: 85.7 fL (ref 78.0–100.0)
MONOS PCT: 6 %
Monocytes Absolute: 0.5 10*3/uL (ref 0.1–1.0)
NEUTROS PCT: 72 %
Neutro Abs: 6.7 10*3/uL (ref 1.7–7.7)
Platelets: 258 10*3/uL (ref 150–400)
RBC: 4.76 MIL/uL (ref 3.87–5.11)
RDW: 12.2 % (ref 11.5–15.5)
WBC: 9.3 10*3/uL (ref 4.0–10.5)

## 2015-09-20 LAB — LIPASE, BLOOD: Lipase: 16 U/L (ref 11–51)

## 2015-09-20 MED ORDER — SODIUM CHLORIDE 0.9 % IV BOLUS (SEPSIS)
1000.0000 mL | Freq: Once | INTRAVENOUS | Status: AC
  Administered 2015-09-20: 1000 mL via INTRAVENOUS

## 2015-09-20 MED ORDER — KETOROLAC TROMETHAMINE 30 MG/ML IJ SOLN
30.0000 mg | Freq: Once | INTRAMUSCULAR | Status: AC
  Administered 2015-09-20: 30 mg via INTRAVENOUS
  Filled 2015-09-20: qty 1

## 2015-09-20 MED ORDER — ONDANSETRON HCL 4 MG/2ML IJ SOLN
4.0000 mg | Freq: Once | INTRAMUSCULAR | Status: AC | PRN
  Administered 2015-09-20: 4 mg via INTRAVENOUS
  Filled 2015-09-20: qty 2

## 2015-09-20 MED ORDER — HYDROCODONE-ACETAMINOPHEN 5-325 MG PO TABS: 1.0000 | ORAL_TABLET | Freq: Four times a day (QID) | ORAL | Status: AC | PRN

## 2015-09-20 MED ORDER — SODIUM CHLORIDE 0.9 % IV BOLUS (SEPSIS)
1000.0000 mL | Freq: Once | INTRAVENOUS | Status: AC
Start: 2015-09-20 — End: 2015-09-20
  Administered 2015-09-20: 1000 mL via INTRAVENOUS

## 2015-09-20 MED ORDER — PROMETHAZINE HCL 25 MG PO TABS: 25.0000 mg | ORAL_TABLET | Freq: Four times a day (QID) | ORAL | Status: AC | PRN

## 2015-09-20 MED ORDER — HYDROMORPHONE HCL 1 MG/ML IJ SOLN
1.0000 mg | Freq: Once | INTRAMUSCULAR | Status: AC
  Administered 2015-09-20: 1 mg via INTRAVENOUS
  Filled 2015-09-20: qty 1

## 2015-09-20 MED ORDER — PROMETHAZINE HCL 25 MG/ML IJ SOLN
25.0000 mg | Freq: Once | INTRAMUSCULAR | Status: AC
  Administered 2015-09-20: 25 mg via INTRAVENOUS
  Filled 2015-09-20: qty 1

## 2015-09-20 MED ORDER — IBUPROFEN 800 MG PO TABS: 800.0000 mg | ORAL_TABLET | Freq: Three times a day (TID) | ORAL | Status: AC | PRN

## 2015-09-20 MED ORDER — ONDANSETRON 4 MG PO TBDP: 4.0000 mg | ORAL_TABLET | Freq: Three times a day (TID) | ORAL | Status: AC | PRN

## 2015-09-20 MED ORDER — ONDANSETRON HCL 4 MG/2ML IJ SOLN
4.0000 mg | Freq: Once | INTRAMUSCULAR | Status: AC
  Administered 2015-09-20: 4 mg via INTRAVENOUS
  Filled 2015-09-20: qty 2

## 2015-09-20 NOTE — ED Notes (Signed)
Pt verbalizes understanding of d/c instructions and denies any further needs at this time. 

## 2015-09-20 NOTE — ED Provider Notes (Signed)
CSN: [id number]     Arrival date & time 09/20/15  1218 History   First MD Initiated Contact with Patient 09/20/15 1347     Chief Complaint  Patient presents with  . Flank Pain     (Consider location/radiation/quality/duration/timing/severity/associated sxs/prior Treatment) HPI Comments: Natalie Horton is a 30 y.o. female with a PMHx of DM2, nephrolithiasis, hemorrhoids, and hypothyroidism, and a PSHx of C-section, cholecystectomy, and R salpingo-oophorectomy, who presents to the ED with complaints of ongoing left flank pain and N/V that began yesterday around 9 AM. She was seen at [facility] [place] last night for these symptoms, treated with Toradol, Dilaudid, Zofran, and fluids, had labs which were overall unremarkable, and was discharged home with Vicodin, ibuprofen, Zofran, and urology follow-up. No imaging was done at that time given that she has had CT imaging of the past that showed nephrolithiasis and it was not felt that repeat imaging was necessary given that her symptoms seem to be due to nephrolithiasis. Patient states that the symptoms are similar to when she has had kidney stones in the past, but are persisting longer than normal.  She describes her left flank pain as 8/10 constant waxing and waning sharp pain radiating into her groin, with no known aggravating factors, and unrelieved with Vicodin, ibuprofen, naproxen, and Tylenol. She states that she was unable to keep the Vicodin down. She states that she typically does not tolerate narcotics very well, and reports that yesterday she did not like how Dilaudid made her feel, but she states that Toradol did help significantly. Additional symptoms include nausea and 6 episodes of nonbloody nonbilious emesis since leaving last night. She also reports that she has hematochezia due to hemorrhoids which is normal for her.  She denies any fevers, chills, chest pain, shortness breath, hematemesis, melena, obstipation, constipation,  diarrhea, dysuria, hematuria, vaginal bleeding or discharge, numbness, tingling, focal weakness, recent travel, sick contacts, suspicious food intake, alcohol use, chronic NSAID use, or recent antibiotics. She has never seen a urologist and has never needed any urologic intervention for her stones. LMP 50yrs ago, has IUD in place.  Patient is a 30 y.o. female presenting with flank pain. The history is provided by the patient and medical records. No language interpreter was used.  Flank Pain This is a recurrent problem. The current episode started yesterday. The problem occurs constantly. The problem has been waxing and waning. Associated symptoms include abdominal pain (L lateral abdomen/groin), nausea and vomiting. Pertinent negatives include no arthralgias, chest pain, chills, fever, myalgias, numbness, urinary symptoms or weakness. Nothing aggravates the symptoms. She has tried acetaminophen, NSAIDs and oral narcotics for the symptoms. The treatment provided no relief.    Past Medical History  Diagnosis Date  . Diabetes mellitus without complication (HCC)   . Hypothyroid   . Renal disorder     kidney stones   Past Surgical History  Procedure Laterality Date  . Cesarean section    . Abdominal hysterectomy    . Oophoectomy     No family history on file. Social History  Substance Use Topics  . Smoking status: Never Smoker   . Smokeless tobacco: None  . Alcohol Use: No   OB History    No data available     Review of Systems  Constitutional: Negative for fever and chills.  Respiratory: Negative for shortness of breath.   Cardiovascular: Negative for chest pain.  Gastrointestinal: Positive for nausea, vomiting and abdominal pain (L lateral abdomen/groin). Negative for diarrhea, constipation and  blood in stool.  Genitourinary: Positive for flank pain. Negative for dysuria, hematuria, vaginal bleeding and vaginal discharge.  Musculoskeletal: Negative for myalgias and arthralgias.   Skin: Negative for color change.  Allergic/Immunologic: Positive for immunocompromised state (diabetic).  Neurological: Negative for weakness and numbness.  Psychiatric/Behavioral: Negative for confusion.   10 Systems reviewed and are negative for acute change except as noted in the HPI.    Allergies  Percocet and Vicodin  Home Medications   Prior to Admission medications   Medication Sig Start Date End Date Taking? Authorizing Provider  albuterol (PROVENTIL HFA;VENTOLIN HFA) 108 (90 BASE) MCG/ACT inhaler Inhale 1 puff into the lungs every 6 (six) hours as needed for wheezing or shortness of breath.    Historical Provider, MD  amphetamine-dextroamphetamine (ADDERALL XR) 10 MG 24 hr capsule Take 10 mg by mouth 2 (two) times daily.    Historical Provider, MD  fluticasone (FLONASE) 50 MCG/ACT nasal spray Place 1 spray into both nostrils daily.    Historical Provider, MD  HYDROcodone-acetaminophen (NORCO/VICODIN) 5-325 MG tablet Take 1-2 tablets by mouth every 6 (six) hours as needed. 09/20/15   Kristen N Ward, DO  ibuprofen (ADVIL,MOTRIN) 800 MG tablet Take 1 tablet (800 mg total) by mouth every 8 (eight) hours as needed for mild pain. 09/20/15   Kristen N Ward, DO  levothyroxine (SYNTHROID, LEVOTHROID) 112 MCG tablet Take 112 mcg by mouth daily before breakfast.    Historical Provider, MD  levothyroxine (SYNTHROID, LEVOTHROID) 88 MCG tablet Take 88 mcg by mouth daily before breakfast.    Historical Provider, MD  ondansetron (ZOFRAN ODT) 4 MG disintegrating tablet Take 1 tablet (4 mg total) by mouth every 8 (eight) hours as needed for nausea or vomiting. 09/20/15   Kristen N Ward, DO   BP 137/101 mmHg  Pulse 93  Temp(Src) 99.4 F (37.4 C) (Oral)  Resp 20  Ht 5\' 7"  (1.702 m)  Wt 136.079 kg  BMI 46.98 kg/m2  SpO2 99% Physical Exam  Constitutional: She is oriented to person, place, and time. Vital signs are normal. She appears well-developed and well-nourished.  Non-toxic appearance. No  distress.  Afebrile, nontoxic, NAD although appears uncomfortable  HENT:  Head: Normocephalic and atraumatic.  Mouth/Throat: Oropharynx is clear and moist and mucous membranes are normal.  Eyes: Conjunctivae and EOM are normal. Right eye exhibits no discharge. Left eye exhibits no discharge.  Neck: Normal range of motion. Neck supple.  Cardiovascular: Normal rate, regular rhythm, normal heart sounds and intact distal pulses.  Exam reveals no gallop and no friction rub.   No murmur heard. Pulmonary/Chest: Effort normal and breath sounds normal. No respiratory distress. She has no decreased breath sounds. She has no wheezes. She has no rhonchi. She has no rales.  Abdominal: Soft. Normal appearance and bowel sounds are normal. She exhibits no distension. There is tenderness in the left upper quadrant. There is CVA tenderness. There is no rigidity, no rebound, no guarding, no tenderness at McBurney's point and negative Murphy's sign.  Morbidly obese which limits exam slightly, but overall Soft, nondistended, +BS throughout, with moderate tenderness in the LUQ/L lateral abdomen tracking towards the L flank, no r/g/r, neg murphy's, neg mcburney's, with moderate L-sided CVA TTP   Musculoskeletal: Normal range of motion.  Neurological: She is alert and oriented to person, place, and time. She has normal strength. No sensory deficit.  Skin: Skin is warm, dry and intact. No rash noted.  Psychiatric: She has a normal mood and affect.  Nursing note  and vitals reviewed.   ED Course  Procedures (including critical care time) Labs Review Labs Reviewed  URINALYSIS, ROUTINE W REFLEX MICROSCOPIC (NOT AT [facility]) - Abnormal; Notable for the following:    Glucose, UA 250 (*)    Protein, ur 100 (*)    All other components within normal limits  URINE MICROSCOPIC-ADD ON - Abnormal; Notable for the following:    Squamous Epithelial / LPF 0-5 (*)    Bacteria, UA FEW (*)    All other components within normal limits    Results for orders placed or performed during the hospital encounter of 09/19/15  Urinalysis, Routine w reflex microscopic- may I&O cath if menses  Result Value Ref Range   Color, Urine YELLOW YELLOW   APPearance CLOUDY (A) CLEAR   Specific Gravity, Urine 1.016 1.005 - 1.030   pH 7.0 5.0 - 8.0   Glucose, UA 100 (A) NEGATIVE mg/dL   Hgb urine dipstick NEGATIVE NEGATIVE   Bilirubin Urine NEGATIVE NEGATIVE   Ketones, ur NEGATIVE NEGATIVE mg/dL   Protein, ur 161100 (A) NEGATIVE mg/dL   Nitrite NEGATIVE NEGATIVE   Leukocytes, UA NEGATIVE NEGATIVE  Pregnancy, urine  Result Value Ref Range   Preg Test, Ur NEGATIVE NEGATIVE  CBC with Differential  Result Value Ref Range   WBC 9.3 4.0 - 10.5 K/uL   RBC 4.76 3.87 - 5.11 MIL/uL   Hemoglobin 14.1 12.0 - 15.0 g/dL   HCT 09.640.8 04.536.0 - 40.946.0 %   MCV 85.7 78.0 - 100.0 fL   MCH 29.6 26.0 - 34.0 pg   MCHC 34.6 30.0 - 36.0 g/dL   RDW 81.112.2 91.411.5 - 78.215.5 %   Platelets 258 150 - 400 K/uL   Neutrophils Relative % 72 %   Neutro Abs 6.7 1.7 - 7.7 K/uL   Lymphocytes Relative 21 %   Lymphs Abs 2.0 0.7 - 4.0 K/uL   Monocytes Relative 6 %   Monocytes Absolute 0.5 0.1 - 1.0 K/uL   Eosinophils Relative 1 %   Eosinophils Absolute 0.1 0.0 - 0.7 K/uL   Basophils Relative 0 %   Basophils Absolute 0.0 0.0 - 0.1 K/uL  Comprehensive metabolic panel  Result Value Ref Range   Sodium 133 (L) 135 - 145 mmol/L   Potassium 3.8 3.5 - 5.1 mmol/L   Chloride 100 (L) 101 - 111 mmol/L   CO2 26 22 - 32 mmol/L   Glucose, Bld 236 (H) 65 - 99 mg/dL   BUN 9 6 - 20 mg/dL   Creatinine, Ser 9.560.57 0.44 - 1.00 mg/dL   Calcium 9.1 8.9 - 21.310.3 mg/dL   Total Protein 7.9 6.5 - 8.1 g/dL   Albumin 4.2 3.5 - 5.0 g/dL   AST 20 15 - 41 U/L   ALT 34 14 - 54 U/L   Alkaline Phosphatase 49 38 - 126 U/L   Total Bilirubin 0.9 0.3 - 1.2 mg/dL   GFR calc non Af Amer >60 >60 mL/min   GFR calc Af Amer >60 >60 mL/min   Anion gap 7 5 - 15  Lipase, blood  Result Value Ref Range   Lipase 16 11 -  51 U/L  Urine microscopic-add on  Result Value Ref Range   Squamous Epithelial / LPF 6-30 (A) NONE SEEN   WBC, UA 0-5 0 - 5 WBC/hpf   RBC / HPF NONE SEEN 0 - 5 RBC/hpf   Bacteria, UA MANY (A) NONE SEEN   Casts GRANULAR CAST (A) NEGATIVE   Urine-Other MUCOUS PRESENT  Imaging Review Dg Abd 1 View  09/20/2015  CLINICAL DATA:  LEFT flank pain and vomiting since leaving emergency room this morning, history kidney stones, diabetes mellitus EXAM: ABDOMEN - 1 VIEW COMPARISON:  CT abdomen and pelvis 06/09/2014 FINDINGS: Lung bases clear. Surgical clips RIGHT upper quadrant corresponding to cholecystectomy. Small LEFT pelvic phlebolith again seen. IUD noted. No definite urinary tract calcification. Bowel gas pattern normal. Osseous structures unremarkable. IMPRESSION: No urinary tract calcifications identified. Electronically Signed   By: Ulyses Southward M.D.   On: 09/20/2015 14:34      CT A/P w/o contrast 06/09/14 Study Result     CLINICAL DATA: 30 year old with nausea vomiting and right upper quadrant pain.  EXAM: CT ABDOMEN AND PELVIS WITHOUT CONTRAST  TECHNIQUE: Multidetector CT imaging of the abdomen and pelvis was performed following the standard protocol without IV contrast.  COMPARISON: None.  FINDINGS: Lower chest: No pleural or pericardial effusion. The lung bases appear clear.  Hepatobiliary: Moderate diffuse hepatic steatosis. Previous cholecystectomy. No biliary dilatation.  Pancreas: The pancreas appears normal.  Spleen: Negative  Adrenals/Urinary Tract: The adrenal glands are normal. Punctate stone is identified within the inferior pole collecting system of the right kidney measuring 3 mm. There are 2 tiny stones identified within the inferior pole the left kidney which measure up to 2-3 mm. No ureteral calculi. The urinary bladder appears normal.  Stomach/Bowel: The stomach is within normal limits. The small bowel loops have a normal course and caliber.  No obstruction. Normal appearance of the colon. The appendix is visualized and appears normal.  Vascular/Lymphatic: Normal appearance of the abdominal aorta. No enlarged retroperitoneal or mesenteric adenopathy. No enlarged pelvic or inguinal lymph nodes.  Reproductive: IUD is identified within the uterus. The adnexal structures are unremarkable.  Other: There is no ascites or focal fluid collections within the abdomen or pelvis.  Musculoskeletal: Review of the visualized osseous structures is negative for aggressive lytic or sclerotic bone lesion. There is degenerative disc disease at the L5-S1 level.  IMPRESSION: 1. No acute findings identified within the abdomen or pelvis. 2. Small bilateral nonobstructing renal calculi. 3. Hepatic steatosis.   Electronically Signed  By: Signa Kell M.D.  On: 06/09/2014 13:32    I have personally reviewed and evaluated these images and lab results as part of my medical decision-making.   EKG Interpretation None      MDM   Final diagnoses:  Flank pain  Non-intractable vomiting with nausea, vomiting of unspecified type  Nephrolithiasis    30 y.o. female here with ongoing L flank pain/n/v x1 day, seen here around midnight last night and had labs, CMP which showed gluc 236 with pseudohyponatremia 133 (corrects for gluc) but otherwise WNL, CBC WNL, lipase WNL, U/A with 6-30 squamous nitrite and leuk neg no RBCs with 0-5 WBC, many bacteria and granular casts seen-- likely contaminated, didn't seem to indicate UTI, no hematuria noted. Upreg neg. No imaging was done since this was felt to be related to nephrolithiasis-- has hx of such (imaging from 2016 and 2014 showing b/l 2-60mm stones). Given toradol and dilaudid and zofran, states she doesn't like how dilaudid makes her feel, prefers toradol as this helps more. Unable to keep PO down at home despite zofran. On exam, tenderness on L flank and LUQ/L lateral abdomen tracking towards  the L flank, no lower abdominal tenderness. No vaginal complaints, no pelvic tenderness, doubt pelvic etiology or need for pelvic exam. Since labs were done 12hrs ago, doubt need for repeat lab work;  will get repeat U/A to see if we can get a better sample; will give fluids, toradol, and zofran. Will get KUB to eval for stones. Will reassess shortly  4:29 PM Abd xray showing no discreet stone, which likely indicates that any stones are very small and would easily pass. U/A today much cleaner, 0-5 squamous, 0-5 WBC and RBCs, few bacteria. Again consistent with stone but not with UTI. Pain initially improved, nausea improved after zofran but came back slightly so phenergan given and has helped. Able to tolerate PO well prior to discharge. Discussed phenergan suppositories but she adamently declined these, will give PO form instead, but discussed that she should also take zofran ODTs since this will work quicker. Stay hydrated, f/up with urologist next week. Home pain meds as directed previously. I explained the diagnosis and have given explicit precautions to return to the ER including for any other new or worsening symptoms. The patient understands and accepts the medical plan as it's been dictated and I have answered their questions. Discharge instructions concerning home care and prescriptions have been given. The patient is STABLE and is discharged to home in good condition.  BP 123/80 mmHg  Pulse 86  Temp(Src) 99.4 F (37.4 C) (Oral)  Resp 18  Ht  (1.702 m)  Wt 136.079 kg  BMI 46.98 kg/m2  SpO2 99%  Meds ordered this encounter  Medications  . ondansetron (ZOFRAN) injection 4 mg    Sig:   . sodium chloride 0.9 % bolus 1,000 mL    Sig:   . ketorolac (TORADOL) 30 MG/ML injection 30 mg    Sig:   . promethazine (PHENERGAN) injection 25 mg    Sig:   . promethazine (PHENERGAN) 25 MG tablet    Sig: Take 1 tablet (25 mg total) by mouth every 6 (six) hours as needed for nausea or vomiting.     Dispense:  20 tablet    Refill:  0    Order Specific Question:  Supervising Provider    Answer:  Eber Hong [[id number]]       Dail Meece Camprubi-Soms, PA-C 09/20/15 1631  Zadie Rhine, MD 09/21/15 [phone]

## 2015-09-20 NOTE — ED Notes (Signed)
PA at bedside.

## 2015-09-20 NOTE — ED Notes (Signed)
Patient complains of ongoing left flank pain with vomiting since leaving this am for same. States that she cannot keep the pain medication down.

## 2015-09-20 NOTE — Discharge Instructions (Signed)
Take ibuprofen as directed as needed for pain using home vicodin for breakthrough pain. Do not drive or operate machinery with pain medication use. May need over-the-counter stool softener with this pain medication use. Use Zofran and/or phenergan as needed for nausea. Strain all urine to try to catch the stone when it passes. Follow-up with the urologist, whom you were told to follow up with at your last ER visit, in the next 1 to 2 weeks for recheck of ongoing pain, however for intractable or uncontrollable pain at home then return to the [facility] emergency department.     Flank Pain Flank pain is pain in your side. The flank is the area of your side between your upper belly (abdomen) and your back. Pain in this area can be caused by many different things. HOME CARE Home care and treatment will depend on the cause of your pain.  Rest as told by your doctor.  Drink enough fluids to keep your pee (urine) clear or pale yellow.  Only take medicine as told by your doctor.  Tell your doctor about any changes in your pain.  Follow up with your doctor. GET HELP RIGHT AWAY IF:   Your pain does not get better with medicine.   You have new symptoms or your symptoms get worse.  Your pain gets worse.   You have belly (abdominal) pain.   You are short of breath.   You always feel sick to your stomach (nauseous).   You keep throwing up (vomiting).   You have puffiness (swelling) in your belly.   You feel light-headed or you pass out (faint).   You have blood in your pee.  You have a fever or lasting symptoms for more than 2-3 days.  You have a fever and your symptoms suddenly get worse. MAKE SURE YOU:   Understand these instructions.  Will watch your condition.  Will get help right away if you are not doing well or get worse.   This information is not intended to replace advice given to you by your health care provider. Make sure you discuss any questions you have  with your health care provider.   Document Released: 12/26/2007 Document Revised: 04/08/2014 Document Reviewed: 10/31/2011 Elsevier Interactive Patient Education 2016 Elsevier Inc.  Kidney Stones Kidney stones (urolithiasis) are deposits that form inside your kidneys. The intense pain is caused by the stone moving through the urinary tract. When the stone moves, the ureter goes into spasm around the stone. The stone is usually passed in the urine.  CAUSES   A disorder that makes certain neck glands produce too much parathyroid hormone (primary hyperparathyroidism).  A buildup of uric acid crystals, similar to gout in your joints.  Narrowing (stricture) of the ureter.  A kidney obstruction present at birth (congenital obstruction).  Previous surgery on the kidney or ureters.  Numerous kidney infections. SYMPTOMS   Feeling sick to your stomach (nauseous).  Throwing up (vomiting).  Blood in the urine (hematuria).  Pain that usually spreads (radiates) to the groin.  Frequency or urgency of urination. DIAGNOSIS   Taking a history and physical exam.  Blood or urine tests.  CT scan.  Occasionally, an examination of the inside of the urinary bladder (cystoscopy) is performed. TREATMENT   Observation.  Increasing your fluid intake.  Extracorporeal shock wave lithotripsy--This is a noninvasive procedure that uses shock waves to break up kidney stones.  Surgery may be needed if you have severe pain or persistent obstruction. There are  various surgical procedures. Most of the procedures are performed with the use of small instruments. Only small incisions are needed to accommodate these instruments, so recovery time is minimized. The size, location, and chemical composition are all important variables that will determine the proper choice of action for you. Talk to your health care provider to better understand your situation so that you will minimize the risk of injury to  yourself and your kidney.  HOME CARE INSTRUCTIONS   Drink enough water and fluids to keep your urine clear or pale yellow. This will help you to pass the stone or stone fragments.  Strain all urine through the provided strainer. Keep all particulate matter and stones for your health care provider to see. The stone causing the pain may be as small as a grain of salt. It is very important to use the strainer each and every time you pass your urine. The collection of your stone will allow your health care provider to analyze it and verify that a stone has actually passed. The stone analysis will often identify what you can do to reduce the incidence of recurrences.  Only take over-the-counter or prescription medicines for pain, discomfort, or fever as directed by your health care provider.  Keep all follow-up visits as told by your health care provider. This is important.  Get follow-up X-rays if required. The absence of pain does not always mean that the stone has passed. It may have only stopped moving. If the urine remains completely obstructed, it can cause loss of kidney function or even complete destruction of the kidney. It is your responsibility to make sure X-rays and follow-ups are completed. Ultrasounds of the kidney can show blockages and the status of the kidney. Ultrasounds are not associated with any radiation and can be performed easily in a matter of minutes.  Make changes to your daily diet as told by your health care provider. You may be told to:  Limit the amount of salt that you eat.  Eat 5 or more servings of fruits and vegetables each day.  Limit the amount of meat, poultry, fish, and eggs that you eat.  Collect a 24-hour urine sample as told by your health care provider.You may need to collect another urine sample every 6-12 months. SEEK MEDICAL CARE IF:  You experience pain that is progressive and unresponsive to any pain medicine you have been prescribed. SEEK  IMMEDIATE MEDICAL CARE IF:   Pain cannot be controlled with the prescribed medicine.  You have a fever or shaking chills.  The severity or intensity of pain increases over 18 hours and is not relieved by pain medicine.  You develop a new onset of abdominal pain.  You feel faint or pass out.  You are unable to urinate.   This information is not intended to replace advice given to you by your health care provider. Make sure you discuss any questions you have with your health care provider.   Document Released: 03/18/2005 Document Revised: 12/07/2014 Document Reviewed: 08/19/2012 Elsevier Interactive Patient Education 2016 Elsevier Inc.  Nausea and Vomiting Nausea means you feel sick to your stomach. Throwing up (vomiting) is a reflex where stomach contents come out of your mouth. HOME CARE   Take medicine as told by your doctor.  Do not force yourself to eat. However, you do need to drink fluids.  If you feel like eating, eat a normal diet as told by your doctor.  Eat rice, wheat, potatoes, bread, lean meats, yogurt,  fruits, and vegetables.  Avoid high-fat foods.  Drink enough fluids to keep your pee (urine) clear or pale yellow.  Ask your doctor how to replace body fluid losses (rehydrate). Signs of body fluid loss (dehydration) include:  Feeling very thirsty.  Dry lips and mouth.  Feeling dizzy.  Dark pee.  Peeing less than normal.  Feeling confused.  Fast breathing or heart rate. GET HELP RIGHT AWAY IF:   You have blood in your throw up.  You have black or bloody poop (stool).  You have a bad headache or stiff neck.  You feel confused.  You have bad belly (abdominal) pain.  You have chest pain or trouble breathing.  You do not pee at least once every 8 hours.  You have cold, clammy skin.  You keep throwing up after 24 to 48 hours.  You have a fever. MAKE SURE YOU:   Understand these instructions.  Will watch your condition.  Will get help  right away if you are not doing well or get worse.   This information is not intended to replace advice given to you by your health care provider. Make sure you discuss any questions you have with your health care provider.   Document Released: 09/04/2007 Document Revised: 06/10/2011 Document Reviewed: 08/17/2010 Elsevier Interactive Patient Education 2016 [employer]   Dietary Guidelines to Help Prevent Kidney Stones Your risk of kidney stones can be decreased by adjusting the foods you eat. The most important thing you can do is drink enough fluid. You should drink enough fluid to keep your urine clear or pale yellow. The following guidelines provide specific information for the type of kidney stone you have had. GUIDELINES ACCORDING TO TYPE OF KIDNEY STONE Calcium Oxalate Kidney Stones  Reduce the amount of salt you eat. Foods that have a lot of salt cause your body to release excess calcium into your urine. The excess calcium can combine with a substance called oxalate to form kidney stones.  Reduce the amount of animal protein you eat if the amount you eat is excessive. Animal protein causes your body to release excess calcium into your urine. Ask your dietitian how much protein from animal sources you should be eating.  Avoid foods that are high in oxalates. If you take vitamins, they should have less than 500 mg of vitamin C. Your body turns vitamin C into oxalates. You do not need to avoid fruits and vegetables high in vitamin C. Calcium Phosphate Kidney Stones  Reduce the amount of salt you eat to help prevent the release of excess calcium into your urine.  Reduce the amount of animal protein you eat if the amount you eat is excessive. Animal protein causes your body to release excess calcium into your urine. Ask your dietitian how much protein from animal sources you should be eating.  Get enough calcium from food or take a calcium supplement (ask your dietitian for  recommendations). Food sources of calcium that do not increase your risk of kidney stones include:  Broccoli.  Dairy products, such as cheese and yogurt.  Pudding. Uric Acid Kidney Stones  Do not have more than 6 oz of animal protein per day. FOOD SOURCES Animal Protein Sources  Meat (all types).  Poultry.  Eggs.  Fish, seafood. Foods High in MirantSalt  Salt seasonings.  Soy sauce.  Teriyaki sauce.  Cured and processed meats.  Salted crackers and snack foods.  Fast food.  Canned soups and most canned foods. Foods High in Oxalates  Grains:  Amaranth.  Barley.  Grits.  Wheat germ.  Bran.  Buckwheat flour.  All bran cereals.  Pretzels.  Whole wheat bread.  Vegetables:  Beans (wax).  Beets and beet greens.  Collard greens.  Eggplant.  Escarole.  Leeks.  Okra.  Parsley.  Rutabagas.  Spinach.  Swiss chard.  Tomato paste.  Fried potatoes.  Sweet potatoes.  Fruits:  Red currants.  Figs.  Kiwi.  Rhubarb.  Meat and Other Protein Sources:  Beans (dried).  Soy burgers and other soybean products.  Miso.  Nuts (peanuts, almonds, pecans, cashews, hazelnuts).  Nut butters.  Sesame seeds and tahini (paste made of sesame seeds).  Poppy seeds.  Beverages:  Chocolate drink mixes.  Soy milk.  Instant iced tea.  Juices made from high-oxalate fruits or vegetables.  Other:  Carob.  Chocolate.  Fruitcake.  Marmalades.   This information is not intended to replace advice given to you by your health care provider. Make sure you discuss any questions you have with your health care provider.   Document Released: 07/13/2010 Document Revised: 03/23/2013 Document Reviewed: 02/12/2013 Elsevier Interactive Patient Education [employer]

## 2015-09-20 NOTE — Discharge Instructions (Signed)
Flank Pain Flank pain refers to pain that is located on the side of the body between the upper abdomen and the back. The pain may occur over a short period of time (acute) or may be long-term or reoccurring (chronic). It may be mild or severe. Flank pain can be caused by many things. CAUSES  Some of the more common causes of flank pain include:  Muscle strains.   Muscle spasms.   A disease of your spine (vertebral disk disease).   A lung infection (pneumonia).   Fluid around your lungs (pulmonary edema).   A kidney infection.   Kidney stones.   A very painful skin rash caused by the chickenpox virus (shingles).   Gallbladder disease.  HOME CARE INSTRUCTIONS  Home care will depend on the cause of your pain. In general,  Rest as directed by your caregiver.  Drink enough fluids to keep your urine clear or pale yellow.  Only take over-the-counter or prescription medicines as directed by your caregiver. Some medicines may help relieve the pain.  Tell your caregiver about any changes in your pain.  Follow up with your caregiver as directed. SEEK IMMEDIATE MEDICAL CARE IF:   Your pain is not controlled with medicine.   You have new or worsening symptoms.  Your pain increases.   You have abdominal pain.   You have shortness of breath.   You have persistent nausea or vomiting.   You have swelling in your abdomen.   You feel faint or pass out.   You have blood in your urine.  You have a fever or persistent symptoms for more than 2-3 days.  You have a fever and your symptoms suddenly get worse. MAKE SURE YOU:   Understand these instructions.  Will watch your condition.  Will get help right away if you are not doing well or get worse.   This information is not intended to replace advice given to you by your health care provider. Make sure you discuss any questions you have with your health care provider.   Document Released: 05/09/2005 Document  Revised: 12/11/2011 Document Reviewed: 10/31/2011 Elsevier Interactive Patient Education 2016 Elsevier Inc.   Dietary Guidelines to Help Prevent Kidney Stones Your risk of kidney stones can be decreased by adjusting the foods you eat. The most important thing you can do is drink enough fluid. You should drink enough fluid to keep your urine clear or pale yellow. The following guidelines provide specific information for the type of kidney stone you have had. GUIDELINES ACCORDING TO TYPE OF KIDNEY STONE Calcium Oxalate Kidney Stones  Reduce the amount of salt you eat. Foods that have a lot of salt cause your body to release excess calcium into your urine. The excess calcium can combine with a substance called oxalate to form kidney stones.  Reduce the amount of animal protein you eat if the amount you eat is excessive. Animal protein causes your body to release excess calcium into your urine. Ask your dietitian how much protein from animal sources you should be eating.  Avoid foods that are high in oxalates. If you take vitamins, they should have less than 500 mg of vitamin C. Your body turns vitamin C into oxalates. You do not need to avoid fruits and vegetables high in vitamin C. Calcium Phosphate Kidney Stones  Reduce the amount of salt you eat to help prevent the release of excess calcium into your urine.  Reduce the amount of animal protein you eat if the amount  you eat is excessive. Animal protein causes your body to release excess calcium into your urine. Ask your dietitian how much protein from animal sources you should be eating.  Get enough calcium from food or take a calcium supplement (ask your dietitian for recommendations). Food sources of calcium that do not increase your risk of kidney stones include:  Broccoli.  Dairy products, such as cheese and yogurt.  Pudding. Uric Acid Kidney Stones  Do not have more than 6 oz of animal protein per day. FOOD SOURCES Animal Protein  Sources  Meat (all types).  Poultry.  Eggs.  Fish, seafood. Foods High in [employer] seasonings.  Soy sauce.  Teriyaki sauce.  Cured and processed meats.  Salted crackers and snack foods.  Fast food.  Canned soups and most canned foods. Foods High in Oxalates  Grains:  Amaranth.  Barley.  Grits.  Wheat germ.  Bran.  Buckwheat flour.  All bran cereals.  Pretzels.  Whole wheat bread.  Vegetables:  Beans (wax).  Beets and beet greens.  Collard greens.  Eggplant.  Escarole.  Leeks.  Okra.  Parsley.  Rutabagas.  Spinach.  Swiss chard.  Tomato paste.  Fried potatoes.  Sweet potatoes.  Fruits:  Red currants.  Figs.  Kiwi.  Rhubarb.  Meat and Other Protein Sources:  Beans (dried).  Soy burgers and other soybean products.  Miso.  Nuts (peanuts, almonds, pecans, cashews, hazelnuts).  Nut butters.  Sesame seeds and tahini (paste made of sesame seeds).  Poppy seeds.  Beverages:  Chocolate drink mixes.  Soy milk.  Instant iced tea.  Juices made from high-oxalate fruits or vegetables.  Other:  Carob.  Chocolate.  Fruitcake.  Marmalades.   This information is not intended to replace advice given to you by your health care provider. Make sure you discuss any questions you have with your health care provider.   Document Released: 07/13/2010 Document Revised: 03/23/2013 Document Reviewed: 02/12/2013 Elsevier Interactive Patient Education 2016 Elsevier Inc.   Kidney Stones Kidney stones (urolithiasis) are deposits that form inside your kidneys. The intense pain is caused by the stone moving through the urinary tract. When the stone moves, the ureter goes into spasm around the stone. The stone is usually passed in the urine.  CAUSES   A disorder that makes certain neck glands produce too much parathyroid hormone (primary hyperparathyroidism).  A buildup of uric acid crystals, similar to gout in your  joints.  Narrowing (stricture) of the ureter.  A kidney obstruction present at birth (congenital obstruction).  Previous surgery on the kidney or ureters.  Numerous kidney infections. SYMPTOMS   Feeling sick to your stomach (nauseous).  Throwing up (vomiting).  Blood in the urine (hematuria).  Pain that usually spreads (radiates) to the groin.  Frequency or urgency of urination. DIAGNOSIS   Taking a history and physical exam.  Blood or urine tests.  CT scan.  Occasionally, an examination of the inside of the urinary bladder (cystoscopy) is performed. TREATMENT   Observation.  Increasing your fluid intake.  Extracorporeal shock wave lithotripsy--This is a noninvasive procedure that uses shock waves to break up kidney stones.  Surgery may be needed if you have severe pain or persistent obstruction. There are various surgical procedures. Most of the procedures are performed with the use of small instruments. Only small incisions are needed to accommodate these instruments, so recovery time is minimized. The size, location, and chemical composition are all important variables that will determine the proper choice of action  for you. Talk to your health care provider to better understand your situation so that you will minimize the risk of injury to yourself and your kidney.  HOME CARE INSTRUCTIONS   Drink enough water and fluids to keep your urine clear or pale yellow. This will help you to pass the stone or stone fragments.  Strain all urine through the provided strainer. Keep all particulate matter and stones for your health care provider to see. The stone causing the pain may be as small as a grain of salt. It is very important to use the strainer each and every time you pass your urine. The collection of your stone will allow your health care provider to analyze it and verify that a stone has actually passed. The stone analysis will often identify what you can do to reduce  the incidence of recurrences.  Only take over-the-counter or prescription medicines for pain, discomfort, or fever as directed by your health care provider.  Keep all follow-up visits as told by your health care provider. This is important.  Get follow-up X-rays if required. The absence of pain does not always mean that the stone has passed. It may have only stopped moving. If the urine remains completely obstructed, it can cause loss of kidney function or even complete destruction of the kidney. It is your responsibility to make sure X-rays and follow-ups are completed. Ultrasounds of the kidney can show blockages and the status of the kidney. Ultrasounds are not associated with any radiation and can be performed easily in a matter of minutes.  Make changes to your daily diet as told by your health care provider. You may be told to:  Limit the amount of salt that you eat.  Eat 5 or more servings of fruits and vegetables each day.  Limit the amount of meat, poultry, fish, and eggs that you eat.  Collect a 24-hour urine sample as told by your health care provider.You may need to collect another urine sample every 6-12 months. SEEK MEDICAL CARE IF:  You experience pain that is progressive and unresponsive to any pain medicine you have been prescribed. SEEK IMMEDIATE MEDICAL CARE IF:   Pain cannot be controlled with the prescribed medicine.  You have a fever or shaking chills.  The severity or intensity of pain increases over 18 hours and is not relieved by pain medicine.  You develop a new onset of abdominal pain.  You feel faint or pass out.  You are unable to urinate.   This information is not intended to replace advice given to you by your health care provider. Make sure you discuss any questions you have with your health care provider.   Document Released: 03/18/2005 Document Revised: 12/07/2014 Document Reviewed: 08/19/2012 Elsevier Interactive Patient Education [id number]  Elsevier Inc.   To find a primary care or specialty doctor please call [phone] or [phone] to access "[facility] Find a Doctor Service."  You may also go on the [facility] website at [email or web address]  There are also multiple [facility] practices throughout the [place] that are frequently accepting new patients. You may find a clinic that is close to your home and contact them.  [facility] -  [address] [place] [phone]  [facility] in [place] (also locations in [place] and [place]) -  [address] [place] [id number] [phone]  [facility] -  [address] [place] [address]  [phone] ° ° ° ° °

## 2020-05-12 ENCOUNTER — Other Ambulatory Visit: Payer: Self-pay | Admitting: Surgical Oncology

## 2020-05-12 DIAGNOSIS — K2100 Gastro-esophageal reflux disease with esophagitis, without bleeding: Secondary | ICD-10-CM

## 2020-05-23 ENCOUNTER — Other Ambulatory Visit: Payer: Self-pay

## 2020-05-23 ENCOUNTER — Ambulatory Visit
Admission: RE | Admit: 2020-05-23 | Discharge: 2020-05-23 | Disposition: A | Discharge status: Home / Self Care | Payer: BC Managed Care – PPO | Source: Ambulatory Visit | Attending: Surgical Oncology | Admitting: Surgical Oncology

## 2020-05-23 DIAGNOSIS — K2100 Gastro-esophageal reflux disease with esophagitis, without bleeding: Secondary | ICD-10-CM

## 2022-03-13 IMAGING — RF DG UGI W/ HIGH DENSITY W/O KUB
6 series · 14 of 19 positions shown · non-contrast
Comparison: CT abdomen pelvis 06/09/2014

CLINICAL DATA: GERD

EXAM:
UPPER GI SERIES WITH KUB
TECHNIQUE: After obtaining a scout radiograph a routine upper GI series was
performed using thin and high density barium.
FLUOROSCOPY TIME:  Fluoroscopy Time:  1 minutes 54 second
Radiation Exposure Index (if provided by the fluoroscopic device):
Number of Acquired Spot Images: 6

[Series 1: one shot · 0.14mm/px · 1 of 1 slices shown (1 of 3)]
[im 1/1]
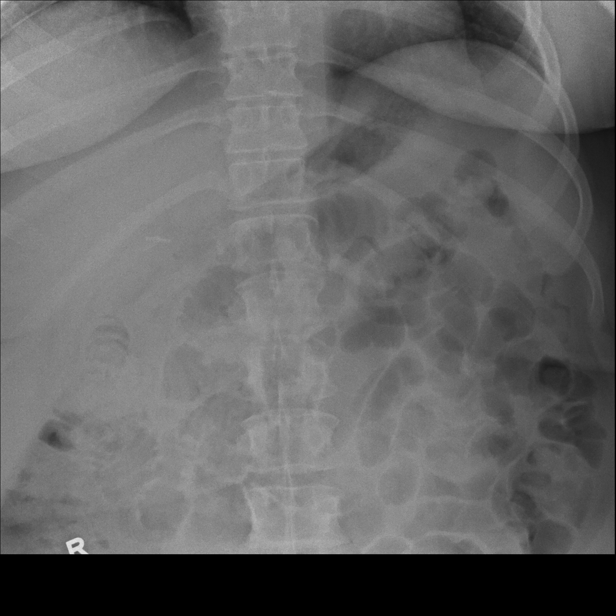

[Series 2: sequence · 0.29mm/px · 3 of 30 frames shown (1 of 3)]
[frame 16/30]
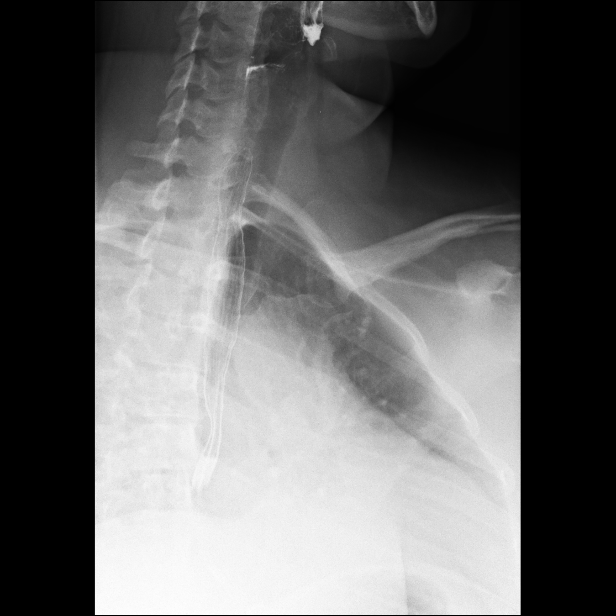
[frame 26/30]
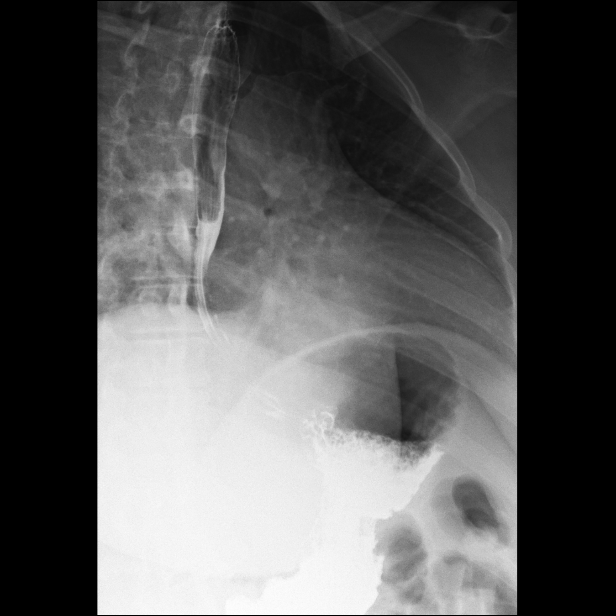
[frame 30/30]
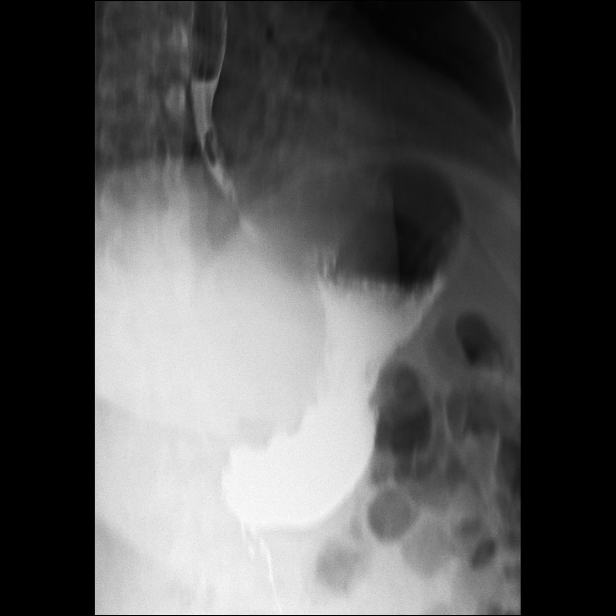

[Series 3: sequence · 0.29mm/px · 1 of 2 frames shown (2 of 3)]
[frame 2/2]
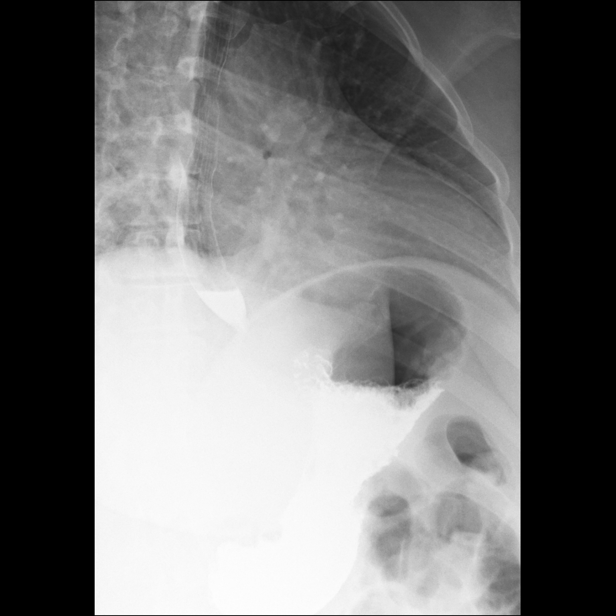

[Series 4: one shot · 0.15mm/px · 3 of 4 slices shown (2 of 3)]
[im 1/4]
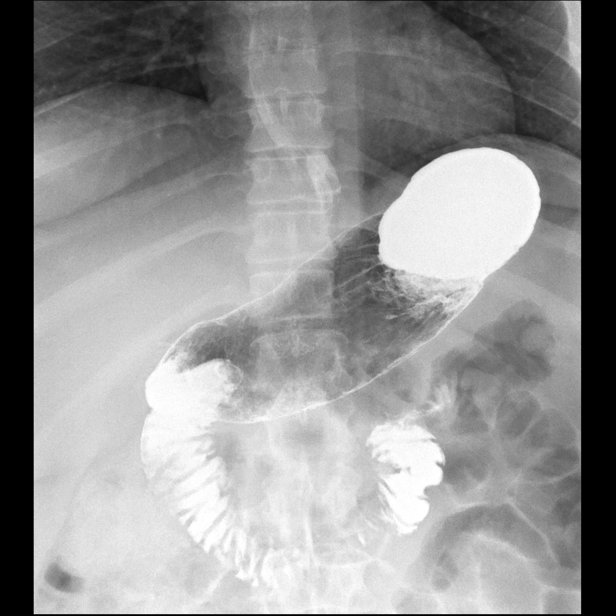
[im 2/4]
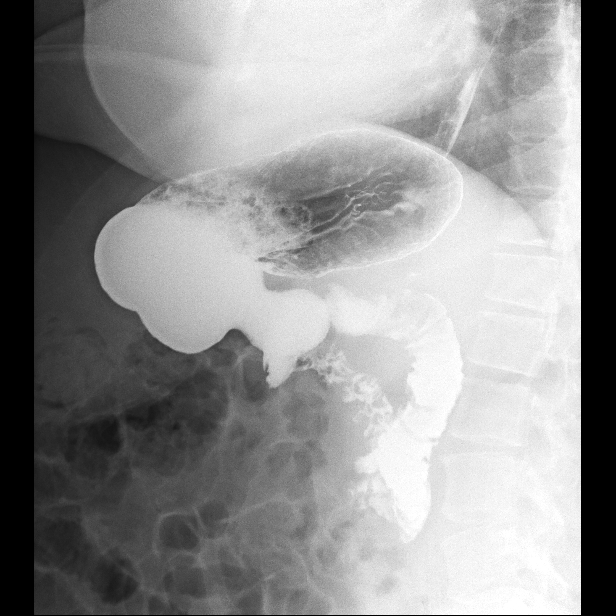
[im 4/4]
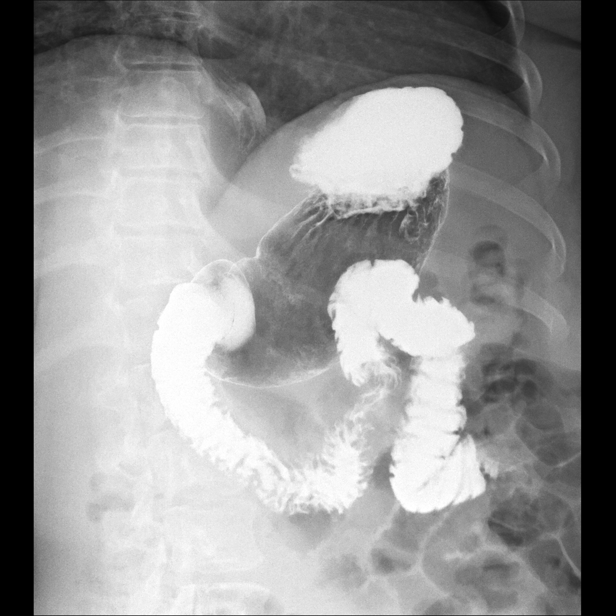

[Series 5: sequence · 0.29mm/px · 3 of 29 frames shown (3 of 3)]
[frame 5/29]
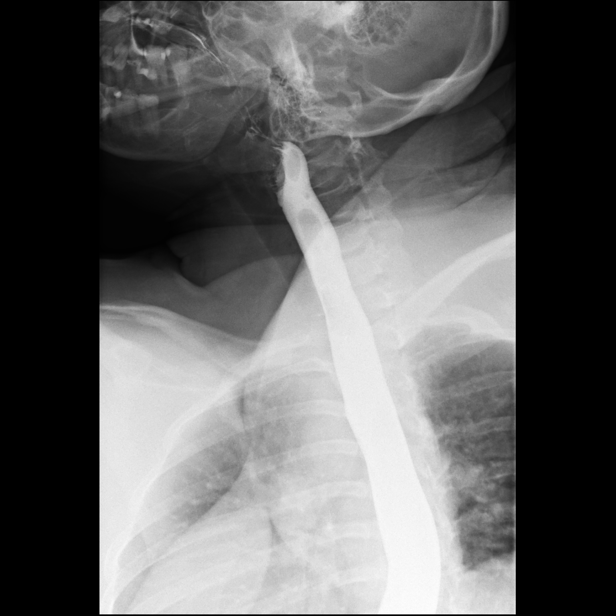
[frame 11/29]
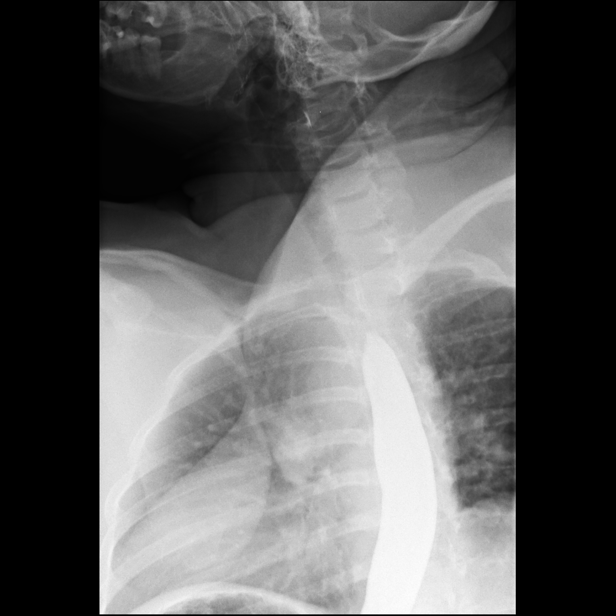
[frame 25/29]
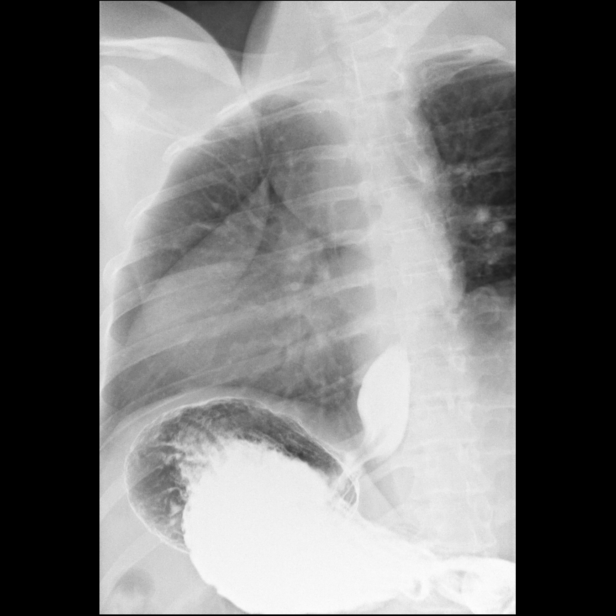

[Series 6: one shot · 0.16mm/px · 3 of 4 slices shown (3 of 3)]
[im 1/4]
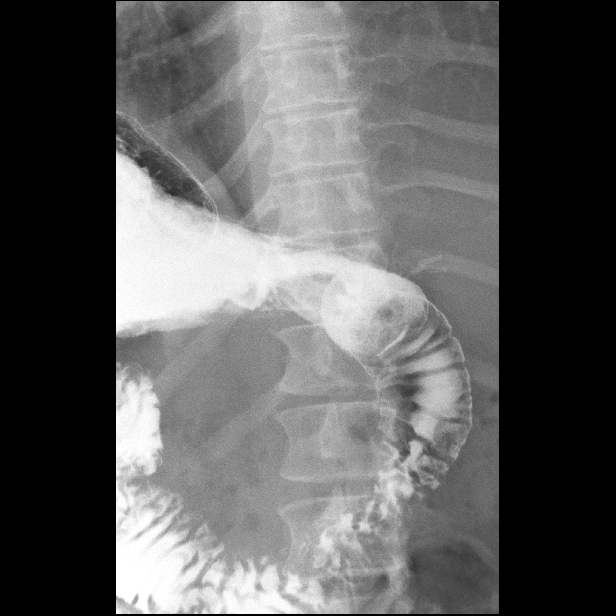
[im 2/4]
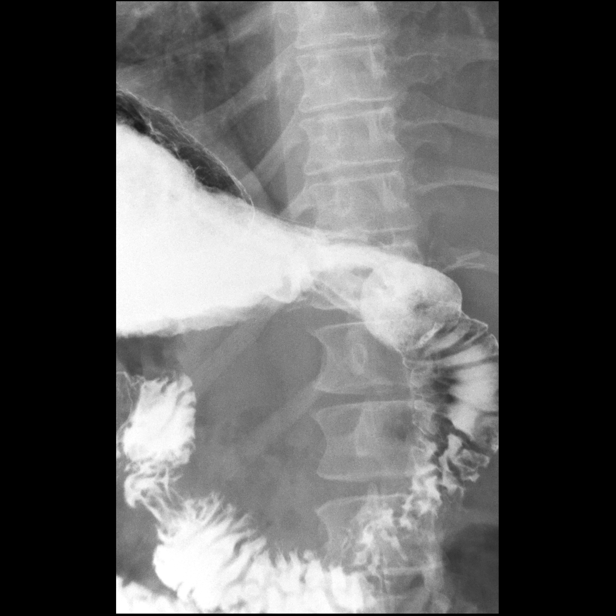
[im 4/4]
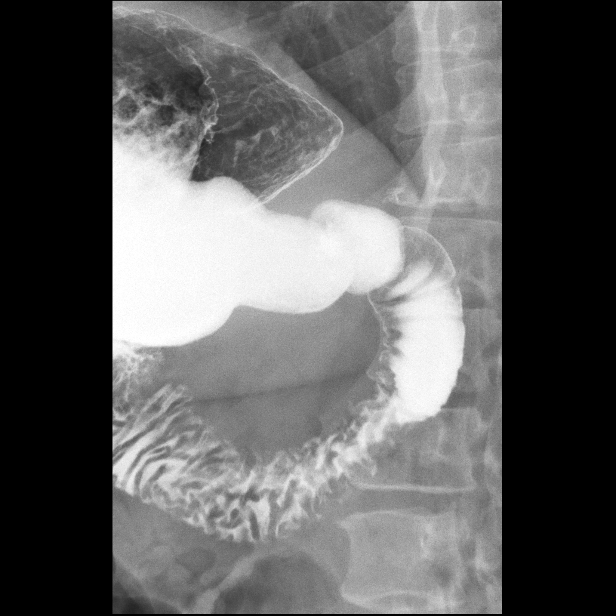

[14 of 19 positions shown; findings below may reference images not displayed]

FINDINGS: Preliminary KUB negative.  Surgical clips in the gallbladder fossa.

Esophageal mucosa and motility normal. Negative for stricture or
mass. Mild gastroesophageal reflux with water siphon maneuver.

Gastric mucosa normal. No ulcer or mass. Normal gastric emptying.
Duodenal bulb normal without scarring or ulceration.
IMPRESSION: Mild gastroesophageal reflux.  No esophageal stricture

Normal stomach and duodenum.

## 2022-08-16 ENCOUNTER — Encounter: Payer: Self-pay | Admitting: Neurology

## 2022-12-03 NOTE — Progress Notes (Deleted)
NEUROLOGY CONSULTATION NOTE  Natalie Horton MRN: [id number] DOB: [birth year]  Referring provider: Monna Fam, FNP Primary care provider: ***  Reason for consult:  migraine  Assessment/Plan:   ***   Subjective:  Natalie Horton is a 37 year old female with hypothyroidism, ADHD, DM 2 and history of kidney stones who presents for migraines.  History supplemented by referring provider's note.  Onset:  *** Location:  *** Quality:  *** Intensity:  ***.  *** denies new headache, thunderclap headache or severe headache that wakes *** from sleep. Aura:  *** Prodrome:  *** Postdrome:  *** Associated symptoms:  ***.  *** denies associated unilateral numbness or weakness. Duration:  *** Frequency:  *** Frequency of abortive medication: *** Triggers:  *** Relieving factors:  *** Activity:  ***  Past NSAIDS/analgesics:  acetaminophen (***) Past abortive triptans:  *** Past abortive ergotamine:  *** Past muscle relaxants:  *** Past anti-emetic:  Zofran Past antihypertensive medications:  *** Past antidepressant medications:  Lexapro Past anticonvulsant medications:  *** Past anti-CGRP:  *** Past vitamins/Herbal/Supplements:  *** Past antihistamines/decongestants:  *** Other past therapies:  ***  Current NSAIDS/analgesics:  *** Current triptans:  *** Current ergotamine:  *** Current anti-emetic:  *** Current muscle relaxants:  *** Current Antihypertensive medications:  *** Current Antidepressant medications:  *** Current Anticonvulsant medications:  *** Current anti-CGRP:  Nurtec PRN Current Vitamins/Herbal/Supplements:  *** Current Antihistamines/Decongestants:  *** Other therapy:  *** Birth control:  *** Other medications:  levothyroxine, hydroxyzine, Adderall   Caffeine:  *** Alcohol:  *** Smoker:  *** Diet:  *** Exercise:  *** Depression:  ***; Anxiety:  *** Other pain:  *** Sleep hygiene:  *** Family history of headache:  ***      PAST MEDICAL  HISTORY: Past Medical History:  Diagnosis Date   Diabetes mellitus without complication (HCC)    Hypothyroid    Renal disorder    kidney stones    PAST SURGICAL HISTORY: Past Surgical History:  Procedure Laterality Date   ABDOMINAL HYSTERECTOMY     CESAREAN SECTION     oophoectomy      MEDICATIONS: Current Outpatient Medications on File Prior to Visit  Medication Sig Dispense Refill   albuterol (PROVENTIL HFA;VENTOLIN HFA) 108 (90 BASE) MCG/ACT inhaler Inhale 1 puff into the lungs every 6 (six) hours as needed for wheezing or shortness of breath.     amphetamine-dextroamphetamine (ADDERALL XR) 10 MG 24 hr capsule Take 10 mg by mouth 2 (two) times daily.     fluticasone (FLONASE) 50 MCG/ACT nasal spray Place 1 spray into both nostrils daily.     HYDROcodone-acetaminophen (NORCO/VICODIN) 5-325 MG tablet Take 1-2 tablets by mouth every 6 (six) hours as needed. 15 tablet 0   ibuprofen (ADVIL,MOTRIN) 800 MG tablet Take 1 tablet (800 mg total) by mouth every 8 (eight) hours as needed for mild pain. 30 tablet 0   levothyroxine (SYNTHROID, LEVOTHROID) 112 MCG tablet Take 112 mcg by mouth daily before breakfast.     levothyroxine (SYNTHROID, LEVOTHROID) 88 MCG tablet Take 88 mcg by mouth daily before breakfast.     ondansetron (ZOFRAN ODT) 4 MG disintegrating tablet Take 1 tablet (4 mg total) by mouth every 8 (eight) hours as needed for nausea or vomiting. 20 tablet 0   promethazine (PHENERGAN) 25 MG tablet Take 1 tablet (25 mg total) by mouth every 6 (six) hours as needed for nausea or vomiting. 20 tablet 0   [DISCONTINUED] glipiZIDE (GLUCOTROL XL) 10 MG 24 hr tablet Take 10  mg by mouth daily with breakfast.     [DISCONTINUED] insulin glargine (LANTUS) 100 UNIT/ML injection Inject 35 Units into the skin at bedtime.     [DISCONTINUED] insulin lispro (HUMALOG) 100 UNIT/ML injection Inject 5 Units into the skin 3 (three) times daily before meals.     No current facility-administered medications  on file prior to visit.    ALLERGIES: Allergies  Allergen Reactions   Percocet [Oxycodone-Acetaminophen] Other (See Comments)    Pass out    Vicodin [Hydrocodone-Acetaminophen] Other (See Comments)    Vomit     FAMILY HISTORY: No family history on file.  Objective:  *** General: No acute distress.  Patient appears well-groomed.   Head:  Normocephalic/atraumatic Eyes:  fundi examined but not visualized Neck: supple, no paraspinal tenderness, full range of motion Back: No paraspinal tenderness Heart: regular rate and rhythm Lungs: Clear to auscultation bilaterally. Vascular: No carotid bruits. Neurological Exam: Mental status: alert and oriented to person, place, and time, speech fluent and not dysarthric, language intact. Cranial nerves: CN I: not tested CN II: pupils equal, round and reactive to light, visual fields intact CN III, IV, VI:  full range of motion, no nystagmus, no ptosis CN V: facial sensation intact. CN VII: upper and lower face symmetric CN VIII: hearing intact CN IX, X: gag intact, uvula midline CN XI: sternocleidomastoid and trapezius muscles intact CN XII: tongue midline Bulk & Tone: normal, no fasciculations. Motor:  muscle strength 5/5 throughout Sensation:  Pinprick, temperature and vibratory sensation intact. Deep Tendon Reflexes:  2+ throughout,  toes downgoing.   Finger to nose testing:  Without dysmetria.   Heel to shin:  Without dysmetria.   Gait:  Normal station and stride.  Romberg negative.    Thank you for allowing me to take part in the care of this patient.  Shon Millet, DO  CC: ***

## 2022-12-04 ENCOUNTER — Ambulatory Visit: Payer: BC Managed Care – PPO | Admitting: Neurology

## 2023-08-14 ENCOUNTER — Encounter (HOSPITAL_BASED_OUTPATIENT_CLINIC_OR_DEPARTMENT_OTHER): Payer: Self-pay

## 2023-08-14 ENCOUNTER — Emergency Department (HOSPITAL_BASED_OUTPATIENT_CLINIC_OR_DEPARTMENT_OTHER)
Admission: EM | Admit: 2023-08-14 | Discharge: 2023-08-14 | Disposition: A | Discharge status: Home / Self Care | Attending: Emergency Medicine | Admitting: Emergency Medicine

## 2023-08-14 ENCOUNTER — Other Ambulatory Visit: Payer: Self-pay

## 2023-08-14 DIAGNOSIS — Z7984 Long term (current) use of oral hypoglycemic drugs: Secondary | ICD-10-CM | POA: Diagnosis not present

## 2023-08-14 DIAGNOSIS — K029 Dental caries, unspecified: Secondary | ICD-10-CM | POA: Diagnosis not present

## 2023-08-14 DIAGNOSIS — E119 Type 2 diabetes mellitus without complications: Secondary | ICD-10-CM | POA: Diagnosis not present

## 2023-08-14 DIAGNOSIS — R220 Localized swelling, mass and lump, head: Secondary | ICD-10-CM | POA: Diagnosis present

## 2023-08-14 DIAGNOSIS — E039 Hypothyroidism, unspecified: Secondary | ICD-10-CM | POA: Diagnosis not present

## 2023-08-14 DIAGNOSIS — Z794 Long term (current) use of insulin: Secondary | ICD-10-CM | POA: Insufficient documentation

## 2023-08-14 MED ORDER — AMOXICILLIN-POT CLAVULANATE 875-125 MG PO TABS
1.0000 | ORAL_TABLET | Freq: Once | ORAL | Status: AC
  Administered 2023-08-14: 1 via ORAL
  Filled 2023-08-14: qty 1

## 2023-08-14 MED ORDER — AMOXICILLIN-POT CLAVULANATE 875-125 MG PO TABS
1.0000 | ORAL_TABLET | Freq: Two times a day (BID) | ORAL | 0 refills | Status: AC
Start: 2023-08-14 — End: 2023-08-28

## 2023-08-14 MED ORDER — AMOXICILLIN-POT CLAVULANATE 875-125 MG PO TABS
1.0000 | ORAL_TABLET | Freq: Two times a day (BID) | ORAL | 0 refills | Status: DC
Start: 2023-08-14 — End: 2023-08-14

## 2023-08-14 NOTE — Discharge Instructions (Addendum)
 You can take Augmentin once in the morning and once in the evening for the next 2 weeks.  Please follow-up with a dentist.  Return to the emergency room if you develop increasing swelling in your face, fever, chills, difficulty swallowing or difficulty speaking.

## 2023-08-14 NOTE — ED Provider Notes (Signed)
 [facility] Provider Note  CSN: [id number] Arrival date & time: 08/14/23 1158  Chief Complaint(s) Dental Pain  HPI Natalie Horton is a 38 y.o. female who is here today for upper dental pain and swelling of the right side of the face.  Symptoms been ongoing for 1 week, worsened over the last 1 day.  She has been taking Motrin  and Tylenol  at home, has been using topical lidocaine.   Past Medical History Past Medical History:  Diagnosis Date   Diabetes mellitus without complication (HCC)    Hypothyroid    Renal disorder    kidney stones   There are no active problems to display for this patient.  Home Medication(s) Prior to Admission medications   Medication Sig Start Date End Date Taking? Authorizing Provider  albuterol (PROVENTIL HFA;VENTOLIN HFA) 108 (90 BASE) MCG/ACT inhaler Inhale 1 puff into the lungs every 6 (six) hours as needed for wheezing or shortness of breath.    [provider]  amoxicillin-clavulanate (AUGMENTIN) 875-125 MG tablet Take 1 tablet by mouth every 12 (twelve) hours for 14 days. 08/14/23 08/28/23  Afton Horse T, DO  amphetamine-dextroamphetamine (ADDERALL XR) 10 MG 24 hr capsule Take 10 mg by mouth 2 (two) times daily.    [provider]  fluticasone (FLONASE) 50 MCG/ACT nasal spray Place 1 spray into both nostrils daily.    [provider]  HYDROcodone -acetaminophen  (NORCO/VICODIN) 5-325 MG tablet Take 1-2 tablets by mouth every 6 (six) hours as needed. 09/20/15   Ward, Clover Dao, DO  ibuprofen  (ADVIL ,MOTRIN ) 800 MG tablet Take 1 tablet (800 mg total) by mouth every 8 (eight) hours as needed for mild pain. 09/20/15   Ward, Clover Dao, DO  levothyroxine (SYNTHROID, LEVOTHROID) 112 MCG tablet Take 112 mcg by mouth daily before breakfast.    [provider]  levothyroxine (SYNTHROID, LEVOTHROID) 88 MCG tablet Take 88 mcg by mouth daily before breakfast.    [provider]   ondansetron  (ZOFRAN  ODT) 4 MG disintegrating tablet Take 1 tablet (4 mg total) by mouth every 8 (eight) hours as needed for nausea or vomiting. 09/20/15   Ward, Clover Dao, DO  promethazine  (PHENERGAN ) 25 MG tablet Take 1 tablet (25 mg total) by mouth every 6 (six) hours as needed for nausea or vomiting. 09/20/15   Street, [place], PA-C  glipiZIDE (GLUCOTROL XL) 10 MG 24 hr tablet Take 10 mg by mouth daily with breakfast.  09/20/15  [provider]  insulin glargine (LANTUS) 100 UNIT/ML injection Inject 35 Units into the skin at bedtime.  09/20/15  [provider]  insulin lispro (HUMALOG) 100 UNIT/ML injection Inject 5 Units into the skin 3 (three) times daily before meals.  09/20/15  [provider]  Past Surgical History Past Surgical History:  Procedure Laterality Date   ABDOMINAL HYSTERECTOMY     CESAREAN SECTION     oophoectomy     Family History History reviewed. No pertinent family history.  Social History Social History   Tobacco Use   Smoking status: Never  Substance Use Topics   Alcohol use: No   Drug use: No   Allergies Percocet [oxycodone-acetaminophen ] and Vicodin [hydrocodone -acetaminophen ]  Review of Systems Review of Systems  Physical Exam Vital Signs  I have reviewed the triage vital signs BP (!) 134/90 (BP Location: Right Arm)   Pulse 82   Temp 98.8 F (37.1 C) (Oral)   Resp 16   Ht 5\' 6"  (1.676 m)   Wt 93 kg   SpO2 97%   BMI 33.09 kg/m   Physical Exam Vitals and nursing note reviewed.  HENT:     Nose: Nose normal.     Comments: No swelling of the nasal passages.  No discharge.  No discoloration.    Mouth/Throat:     Comments: Obvious dental carry of the right front tooth.  There is no obvious abscess.  There is some surrounding swelling of the right side of the face.  No floor of the mouth  infection.  No raised tongue.  No trismus, no difficulty swallowing.    ED Results and Treatments Labs (all labs ordered are listed, but only abnormal results are displayed) Labs Reviewed - No data to display                                                                                                                        Radiology No results found.  Pertinent labs & imaging results that were available during my care of the patient were reviewed by me and considered in my medical decision making (see MDM for details).  Medications Ordered in ED Medications  amoxicillin-clavulanate (AUGMENTIN) 875-125 MG per tablet 1 tablet (has no administration in time range)                                                                                                                                     Procedures Procedures  (including critical care time)  Medical Decision Making / ED Course   This patient presents to the ED for concern of dental pain, this involves an extensive number of treatment options, and is a complaint that carries with  it a high risk of complications and morbidity.  The differential diagnosis includes dental caries, dental abscess, facial pain.  Less likely Ludwick's, likely sequelae deep space infection of the face.  Less likely mucormycosis. MDM: Patient peers to have a dental carry with some subsequent swelling and inflammation.  Considered imaging in this patient, however given her reassuring exam I do not believe that is indicated at this time.  She responded well to oral antibiotics.  Considered labs in this patient, however patient has normal vital signs, no systemic signs of infection.  Do not believe would change management.  Patient assures me she is trying to follow-up with a dentist.  Counseled on return precautions.  Will discharge.   Additional history obtained: -Additional history obtained from partner at bedside -External records from outside source  obtained and reviewed including: Chart review including previous notes, labs, imaging, consultation notes   Lab Tests: -I ordered, reviewed, and interpreted labs.   The pertinent results include:   Labs Reviewed - No data to display      Medicines ordered and prescription drug management: Meds ordered this encounter  Medications   amoxicillin-clavulanate (AUGMENTIN) 875-125 MG per tablet 1 tablet   DISCONTD: amoxicillin-clavulanate (AUGMENTIN) 875-125 MG tablet    Sig: Take 1 tablet by mouth every 12 (twelve) hours.    Dispense:  14 tablet    Refill:  0   amoxicillin-clavulanate (AUGMENTIN) 875-125 MG tablet    Sig: Take 1 tablet by mouth every 12 (twelve) hours for 14 days.    Dispense:  28 tablet    Refill:  0    -I have reviewed the patients home medicines and have made adjustments as needed   Cardiac Monitoring: The patient was maintained on a cardiac monitor.  I personally viewed and interpreted the cardiac monitored which showed an underlying rhythm of: Normal sinus rhythm  Social Determinants of Health:  Factors impacting patients care include: Lack of access to primary care   Reevaluation: After the interventions noted above, I reevaluated the patient and found that they have :improved  Co morbidities that complicate the patient evaluation  Past Medical History:  Diagnosis Date   Diabetes mellitus without complication (HCC)    Hypothyroid    Renal disorder    kidney stones      Dispostion: I considered admission for this patient, however the patient is appropriate for outpatient follow-up.     Final Clinical Impression(s) / ED Diagnoses Final diagnoses:  None     @PCDICTATION @    Afton Horse T, DO 08/14/23 1300

## 2023-08-14 NOTE — ED Notes (Signed)
 Reviewed AVS/discharge instruction with patient. Time allotted for and all questions answered. Patient is agreeable for d/c and escorted to ed exit by staff.

## 2023-08-14 NOTE — ED Triage Notes (Signed)
 Pt to ED c/o right upper dental pain x 1 week, getting worse. Has not been to the dentist

## 2024-02-12 ENCOUNTER — Emergency Department (HOSPITAL_BASED_OUTPATIENT_CLINIC_OR_DEPARTMENT_OTHER): Admission: EM | Admit: 2024-02-12 | Discharge: 2024-02-12 | Disposition: A | Discharge status: Home / Self Care

## 2024-02-12 ENCOUNTER — Other Ambulatory Visit: Payer: Self-pay

## 2024-02-12 ENCOUNTER — Emergency Department (HOSPITAL_BASED_OUTPATIENT_CLINIC_OR_DEPARTMENT_OTHER): Admitting: Radiology

## 2024-02-12 DIAGNOSIS — S298XXA Other specified injuries of thorax, initial encounter: Secondary | ICD-10-CM

## 2024-02-12 DIAGNOSIS — R0781 Pleurodynia: Secondary | ICD-10-CM | POA: Diagnosis present

## 2024-02-12 DIAGNOSIS — W51XXXA Accidental striking against or bumped into by another person, initial encounter: Secondary | ICD-10-CM | POA: Insufficient documentation

## 2024-02-12 DIAGNOSIS — S20211A Contusion of right front wall of thorax, initial encounter: Secondary | ICD-10-CM | POA: Diagnosis not present

## 2024-02-12 MED ORDER — LIDOCAINE 5 % EX PTCH
1.0000 | MEDICATED_PATCH | CUTANEOUS | Status: DC
  Administered 2024-02-12: 1 via TRANSDERMAL
  Filled 2024-02-12: qty 1

## 2024-02-12 NOTE — ED Provider Notes (Signed)
 Natalie Horton Provider Note   CSN: [id number] Arrival date & time: 02/12/24  1332     Patient presents with: Rib Injury   Natalie Horton is a 38 y.o. female.     Patient presents to the emergency department today for evaluation of right rib pain.  Patient sustained an injury 5 days ago while sparring.  She states that someone fell and landed on her right anterior ribs.  Patient aggravated the injury yesterday after she twisted.  She has had some shortness of breath and pain that increases with deep breathing.  No lightheadedness or syncope.  She presents today to see if she had a rib fracture.  She has been taking Tylenol  and ibuprofen .  No significant shortness of breath.  No cough or hemoptysis.  Denies seeing any bruising to the chest wall.       Prior to Admission medications   Medication Sig Start Date End Date Taking? Authorizing Provider  albuterol (PROVENTIL HFA;VENTOLIN HFA) 108 (90 BASE) MCG/ACT inhaler Inhale 1 puff into the lungs every 6 (six) hours as needed for wheezing or shortness of breath.    [provider]  amphetamine-dextroamphetamine (ADDERALL XR) 10 MG 24 hr capsule Take 10 mg by mouth 2 (two) times daily.    [provider]  fluticasone (FLONASE) 50 MCG/ACT nasal spray Place 1 spray into both nostrils daily.    [provider]  HYDROcodone -acetaminophen  (NORCO/VICODIN) 5-325 MG tablet Take 1-2 tablets by mouth every 6 (six) hours as needed. 09/20/15   Ward, Josette SAILOR, DO  ibuprofen  (ADVIL ,MOTRIN ) 800 MG tablet Take 1 tablet (800 mg total) by mouth every 8 (eight) hours as needed for mild pain. 09/20/15   Ward, Josette SAILOR, DO  levothyroxine (SYNTHROID, LEVOTHROID) 112 MCG tablet Take 112 mcg by mouth daily before breakfast.    [provider]  levothyroxine (SYNTHROID, LEVOTHROID) 88 MCG tablet Take 88 mcg by mouth daily before breakfast.    [provider]  ondansetron   (ZOFRAN  ODT) 4 MG disintegrating tablet Take 1 tablet (4 mg total) by mouth every 8 (eight) hours as needed for nausea or vomiting. 09/20/15   Ward, Josette SAILOR, DO  promethazine  (PHENERGAN ) 25 MG tablet Take 1 tablet (25 mg total) by mouth every 6 (six) hours as needed for nausea or vomiting. 09/20/15   Street, [place], PA-C  glipiZIDE (GLUCOTROL XL) 10 MG 24 hr tablet Take 10 mg by mouth daily with breakfast.  09/20/15  [provider]  insulin glargine (LANTUS) 100 UNIT/ML injection Inject 35 Units into the skin at bedtime.  09/20/15  [provider]  insulin lispro (HUMALOG) 100 UNIT/ML injection Inject 5 Units into the skin 3 (three) times daily before meals.  09/20/15  [provider]    Allergies: Percocet [oxycodone-acetaminophen ] and Vicodin [hydrocodone -acetaminophen ]    Review of Systems  Updated Vital Signs BP 124/74 (BP Location: Right Arm)   Pulse 77   Temp 98.4 F (36.9 C) (Oral)   Resp 16   SpO2 100%   Physical Exam Vitals and nursing note reviewed.  Constitutional:      Appearance: She is well-developed.  HENT:     Head: Normocephalic and atraumatic.  Eyes:     Conjunctiva/sclera: Conjunctivae normal.  Pulmonary:     Effort: No respiratory distress.     Breath sounds: Normal breath sounds.     Comments: Good air movement bilaterally. Chest:       Comments: Patient reports tenderness anterior right  chest wall underneath the breast Musculoskeletal:     Cervical back: Normal range of motion and neck supple.  Skin:    General: Skin is warm and dry.  Neurological:     Mental Status: She is alert.     (all labs ordered are listed, but only abnormal results are displayed) Labs Reviewed  PREGNANCY, URINE    EKG: None  Radiology: DG Ribs Unilateral W/Chest Right Result Date: 02/12/2024 CLINICAL DATA:  Injury while sparring.  Right-sided rib pain. EXAM: RIGHT RIBS AND CHEST - 3+ VIEW COMPARISON:  Chest radiograph 07/10/2012. FINDINGS:  No acute osseous abnormality is identified. No obvious displaced rib fracture. There is no evidence of pneumothorax or pleural effusion. Both lungs are clear. Heart size and mediastinal contours are within normal limits. Surgical clips in the epigastric area and right upper quadrant. IMPRESSION: 1. No displaced rib fracture identified. 2. No acute cardiopulmonary findings. Electronically Signed   By: Harrietta Sherry M.D.   On: 02/12/2024 15:01     Procedures   Medications Ordered in the ED  lidocaine (LIDODERM) 5 % 1 patch (has no administration in time range)   ED Course  Patient seen and examined. History obtained directly from patient. Work-up including imaging, were reviewed.    Labs/EKG: None ordered  Imaging: Independently reviewed and interpreted.  This included: Chest x-ray with rib, agree negative.  Medications/Fluids: Ordered: Lidoderm patch   Most recent vital signs reviewed and are as follows: BP 124/74 (BP Location: Right Arm)   Pulse 77   Temp 98.4 F (36.9 C) (Oral)   Resp 16   SpO2 100%   Initial impression: Right-sided chest wall/rib contusion.  Home treatment plan: 10 deep breath every hour while awake, continue OTC meds, warm compresses.  Return instructions discussed with patient: New or worsening symptoms, increasing shortness of breath or trouble breathing.  Follow-up instructions discussed with patient: Follow-up with PCP if not improved in 1 week.                                  Medical Decision Making Risk Prescription drug management.   Patient with chest wall pain and rib injury after an injury occurring 5 days ago.  Chest x-ray negative for pneumothorax.  No evidence of contusion.  No obvious rib fracture.  Possible cartilage injury or potentially nondisplaced rib fracture, but less likely bruising.  Will continue symptomatic treatment.     Final diagnoses:  Contusion of rib, initial encounter    ED Discharge Orders     None           Natalie Chew, PA-C 02/12/24 1640    Natalie Caron PARAS, DO 02/12/24 2308

## 2024-02-12 NOTE — ED Triage Notes (Signed)
 Patient states she was sparring on Saturday and her sparring partner fell on her. States she heard a pop and has pain to right side of ribs in front. Now has pain with inspiration.

## 2024-02-12 NOTE — ED Notes (Signed)
 DC paperwork given and verbally understood.

## 2024-02-12 NOTE — Discharge Instructions (Signed)
 Please read and follow all provided instructions.  Your diagnoses today include:  1. Contusion of rib, initial encounter     Tests performed today include: Chest and rib x-ray: No severe injury, obvious broken rib or collapsed lung Vital signs. See below for your results today.   Medications prescribed:  Lidoderm patch  Please use over-the-counter NSAID medications (ibuprofen , naproxen) or Tylenol  (acetaminophen ) as directed on the packaging for pain -- as long as you do not have any reasons avoid these medications. Reasons to avoid NSAID medications include: weak kidneys, a history of bleeding in your stomach or gut, or uncontrolled high blood pressure or previous heart attack. Reasons to avoid Tylenol  include: liver problems or ongoing alcohol use. Never take more than 4000mg  or 8 Extra strength Tylenol  in a 24 hour period.     Take any prescribed medications only as directed.  Home care instructions:  Follow any educational materials contained in this packet.  BE VERY CAREFUL not to take multiple medicines containing Tylenol  (also called acetaminophen ). Doing so can lead to an overdose which can damage your liver and cause liver failure and possibly death.   Follow-up instructions: Please follow-up with your primary care provider as needed for further evaluation of your symptoms.   Return instructions:  Please return to the Emergency Department if you experience worsening symptoms.  Please return if you have any other emergent concerns.  Additional Information:  Your vital signs today were: BP 124/74 (BP Location: Right Arm)   Pulse 77   Temp 98.4 F (36.9 C) (Oral)   Resp 16   SpO2 100%  If your blood pressure (BP) was elevated above 135/85 this visit, please have this repeated by your doctor within one month. --------------
